# Patient Record
Sex: Female | Born: 1995 | Race: Black or African American | Hispanic: No | Marital: Single | Smoking: Never smoker
Health system: Southern US, Community
[De-identification: ages and names within clinical notes are randomized; demographics above are authoritative.]

## PROBLEM LIST (undated history)

## (undated) DIAGNOSIS — K5090 Crohn's disease, unspecified, without complications: Secondary | ICD-10-CM

## (undated) DIAGNOSIS — R109 Unspecified abdominal pain: Secondary | ICD-10-CM

## (undated) DIAGNOSIS — G8929 Other chronic pain: Secondary | ICD-10-CM

---

## 2013-11-03 ENCOUNTER — Emergency Department (HOSPITAL_COMMUNITY)
Admission: EM | Admit: 2013-11-03 | Discharge: 2013-11-03 | Disposition: A | Discharge status: Home / Self Care | Payer: BC Managed Care – PPO | Attending: Emergency Medicine | Admitting: Emergency Medicine

## 2013-11-03 ENCOUNTER — Encounter (HOSPITAL_COMMUNITY): Payer: Self-pay | Admitting: Emergency Medicine

## 2013-11-03 DIAGNOSIS — N390 Urinary tract infection, site not specified: Secondary | ICD-10-CM | POA: Insufficient documentation

## 2013-11-03 DIAGNOSIS — R110 Nausea: Secondary | ICD-10-CM | POA: Insufficient documentation

## 2013-11-03 DIAGNOSIS — R109 Unspecified abdominal pain: Secondary | ICD-10-CM | POA: Insufficient documentation

## 2013-11-03 DIAGNOSIS — R509 Fever, unspecified: Secondary | ICD-10-CM | POA: Insufficient documentation

## 2013-11-03 DIAGNOSIS — Z3202 Encounter for pregnancy test, result negative: Secondary | ICD-10-CM | POA: Insufficient documentation

## 2013-11-03 LAB — URINALYSIS, ROUTINE W REFLEX MICROSCOPIC
BILIRUBIN URINE: NEGATIVE
Glucose, UA: NEGATIVE mg/dL
Hgb urine dipstick: NEGATIVE
KETONES UR: NEGATIVE mg/dL
NITRITE: POSITIVE — AB
Protein, ur: NEGATIVE mg/dL
SPECIFIC GRAVITY, URINE: 1.011 (ref 1.005–1.030)
UROBILINOGEN UA: 1 mg/dL (ref 0.0–1.0)
pH: 6 (ref 5.0–8.0)

## 2013-11-03 LAB — COMPREHENSIVE METABOLIC PANEL
ALT: 18 U/L (ref 0–35)
ANION GAP: 10 (ref 5–15)
AST: 23 U/L (ref 0–37)
Albumin: 2.8 g/dL — ABNORMAL LOW (ref 3.5–5.2)
Alkaline Phosphatase: 130 U/L — ABNORMAL HIGH (ref 39–117)
BILIRUBIN TOTAL: 0.3 mg/dL (ref 0.3–1.2)
BUN: 5 mg/dL — AB (ref 6–23)
CHLORIDE: 105 meq/L (ref 96–112)
CO2: 25 meq/L (ref 19–32)
Calcium: 8.7 mg/dL (ref 8.4–10.5)
Creatinine, Ser: 0.74 mg/dL (ref 0.50–1.10)
Glucose, Bld: 78 mg/dL (ref 70–99)
Potassium: 3.7 mEq/L (ref 3.7–5.3)
SODIUM: 140 meq/L (ref 137–147)
Total Protein: 6.6 g/dL (ref 6.0–8.3)

## 2013-11-03 LAB — CBC WITH DIFFERENTIAL/PLATELET
BASOS PCT: 0 % (ref 0–1)
Basophils Absolute: 0 10*3/uL (ref 0.0–0.1)
EOS ABS: 0.3 10*3/uL (ref 0.0–0.7)
EOS PCT: 3 % (ref 0–5)
HEMATOCRIT: 27.7 % — AB (ref 36.0–46.0)
HEMOGLOBIN: 8.2 g/dL — AB (ref 12.0–15.0)
LYMPHS PCT: 16 % (ref 12–46)
Lymphs Abs: 1.8 10*3/uL (ref 0.7–4.0)
MCH: 16.6 pg — ABNORMAL LOW (ref 26.0–34.0)
MCHC: 29.6 g/dL — AB (ref 30.0–36.0)
MCV: 56.1 fL — ABNORMAL LOW (ref 78.0–100.0)
MONOS PCT: 5 % (ref 3–12)
Monocytes Absolute: 0.6 10*3/uL (ref 0.1–1.0)
NEUTROS ABS: 8.6 10*3/uL — AB (ref 1.7–7.7)
NEUTROS PCT: 76 % (ref 43–77)
Platelets: 533 10*3/uL — ABNORMAL HIGH (ref 150–400)
RBC: 4.94 MIL/uL (ref 3.87–5.11)
RDW: 19.8 % — ABNORMAL HIGH (ref 11.5–15.5)
WBC: 11.3 10*3/uL — ABNORMAL HIGH (ref 4.0–10.5)

## 2013-11-03 LAB — URINE MICROSCOPIC-ADD ON

## 2013-11-03 LAB — PREGNANCY, URINE: Preg Test, Ur: NEGATIVE

## 2013-11-03 MED ORDER — MORPHINE SULFATE 4 MG/ML IJ SOLN
4.0000 mg | INTRAMUSCULAR | Status: DC | PRN
  Administered 2013-11-03: 4 mg via INTRAVENOUS
  Filled 2013-11-03: qty 1

## 2013-11-03 MED ORDER — CEPHALEXIN 500 MG PO CAPS: 500.0000 mg | ORAL_CAPSULE | Freq: Three times a day (TID) | ORAL | Status: DC

## 2013-11-03 MED ORDER — SODIUM CHLORIDE 0.9 % IV BOLUS (SEPSIS)
1000.0000 mL | Freq: Once | INTRAVENOUS | Status: AC
  Administered 2013-11-03: 1000 mL via INTRAVENOUS

## 2013-11-03 MED ORDER — CEPHALEXIN 250 MG PO CAPS
500.0000 mg | ORAL_CAPSULE | Freq: Once | ORAL | Status: AC
  Administered 2013-11-03: 500 mg via ORAL
  Filled 2013-11-03: qty 2

## 2013-11-03 NOTE — ED Provider Notes (Signed)
CSN: [id number]     Arrival date & time 11/03/13  1105 History   First MD Initiated Contact with Patient 11/03/13 1148     Chief Complaint  Patient presents with  . Abdominal Pain      The history is provided by the patient.   patient reports lower abdominal discomfort and cramping over the past 48 hours.  She reports subjective fever.  She has had nausea without vomiting.  She denies diarrhea.  No urinary complaints.  No vaginal complaints.  No pain with intercourse.  She reports her last normal menstrual cycle was in February 2015.  She received Injection at that time.  She's had no abnormal vaginal bleeding and denies vaginal bleeding at this time.  History reviewed. No pertinent past medical history. History reviewed. No pertinent past surgical history. History reviewed. No pertinent family history. History  Substance Use Topics  . Smoking status: Never Smoker   . Smokeless tobacco: Not on file  . Alcohol Use: No   OB History   Grav Para Term Preterm Abortions TAB SAB Ect Mult Living                 Review of Systems  Gastrointestinal: Positive for abdominal pain.  All other systems reviewed and are negative.     Allergies  Review of patient's allergies indicates no known allergies.  Home Medications   Prior to Admission medications   Not on File   BP 122/60  Pulse 96  Temp(Src) 98.6 F (37 C) (Oral)  Resp 18  SpO2 100% Physical Exam  Nursing note and vitals reviewed. Constitutional: She is oriented to person, place, and time. She appears well-developed and well-nourished. No distress.  HENT:  Head: Normocephalic and atraumatic.  Eyes: EOM are normal.  Neck: Normal range of motion.  Cardiovascular: Normal rate, regular rhythm and normal heart sounds.   Pulmonary/Chest: Effort normal and breath sounds normal.  Abdominal: Soft. She exhibits no distension.  Mild suprapubic tenderness without guarding or rebound  Musculoskeletal: Normal range of motion.   Neurological: She is alert and oriented to person, place, and time.  Skin: Skin is warm and dry.  Psychiatric: She has a normal mood and affect. Judgment normal.    ED Course  Procedures (including critical care time) Labs Review Labs Reviewed  CBC WITH DIFFERENTIAL - Abnormal; Notable for the following:    WBC 11.3 (*)    Hemoglobin 8.2 (*)    HCT 27.7 (*)    MCV 56.1 (*)    MCH 16.6 (*)    MCHC 29.6 (*)    RDW 19.8 (*)    Platelets 533 (*)    Neutro Abs 8.6 (*)    All other components within normal limits  COMPREHENSIVE METABOLIC PANEL - Abnormal; Notable for the following:    BUN 5 (*)    Albumin 2.8 (*)    Alkaline Phosphatase 130 (*)    All other components within normal limits  URINALYSIS, ROUTINE W REFLEX MICROSCOPIC - Abnormal; Notable for the following:    Color, Urine ORANGE (*)    APPearance CLOUDY (*)    Nitrite POSITIVE (*)    Leukocytes, UA TRACE (*)    All other components within normal limits  URINE MICROSCOPIC-ADD ON - Abnormal; Notable for the following:    Squamous Epithelial / LPF FEW (*)    All other components within normal limits  URINE CULTURE  PREGNANCY, URINE    Imaging Review No results found.   EKG  Interpretation None      MDM   Final diagnoses:  Urinary tract infection without hematuria, site unspecified    Patient with nitrite positive urine.  Urine culture sent.  Keflex.  Repeat abdominal exam with mild suprapubic tenderness.  No right lower quadrant tenderness.  Doubt appendicitis.  Patient understands to return to the ER for new or worsening symptoms including but not limited to worsening abdominal pain.    Lyanne Co, MD 11/03/13 [phone]

## 2013-11-03 NOTE — ED Notes (Signed)
Pt reports lower abd pain, cramping, fever, nausea. Denies any vomiting, diarrhea, urinary or vaginal symptoms.

## 2013-11-03 NOTE — Discharge Instructions (Signed)

## 2013-11-04 LAB — URINE CULTURE
COLONY COUNT: NO GROWTH
CULTURE: NO GROWTH

## 2013-11-13 ENCOUNTER — Encounter (HOSPITAL_COMMUNITY): Payer: Self-pay | Admitting: Emergency Medicine

## 2013-11-13 ENCOUNTER — Emergency Department (HOSPITAL_COMMUNITY)
Admission: EM | Admit: 2013-11-13 | Discharge: 2013-11-13 | Disposition: A | Discharge status: Home / Self Care | Payer: BC Managed Care – PPO | Attending: Emergency Medicine | Admitting: Emergency Medicine

## 2013-11-13 DIAGNOSIS — N898 Other specified noninflammatory disorders of vagina: Secondary | ICD-10-CM | POA: Insufficient documentation

## 2013-11-13 DIAGNOSIS — Z792 Long term (current) use of antibiotics: Secondary | ICD-10-CM | POA: Diagnosis not present

## 2013-11-13 DIAGNOSIS — Z3202 Encounter for pregnancy test, result negative: Secondary | ICD-10-CM | POA: Diagnosis not present

## 2013-11-13 LAB — URINALYSIS, ROUTINE W REFLEX MICROSCOPIC
BILIRUBIN URINE: NEGATIVE
Glucose, UA: NEGATIVE mg/dL
Hgb urine dipstick: NEGATIVE
KETONES UR: 15 mg/dL — AB
Nitrite: NEGATIVE
PH: 5.5 (ref 5.0–8.0)
Protein, ur: NEGATIVE mg/dL
Specific Gravity, Urine: 1.029 (ref 1.005–1.030)
Urobilinogen, UA: 0.2 mg/dL (ref 0.0–1.0)

## 2013-11-13 LAB — WET PREP, GENITAL
Trich, Wet Prep: NONE SEEN
Yeast Wet Prep HPF POC: NONE SEEN

## 2013-11-13 LAB — URINE MICROSCOPIC-ADD ON

## 2013-11-13 LAB — POC URINE PREG, ED: Preg Test, Ur: NEGATIVE

## 2013-11-13 MED ORDER — LIDOCAINE HCL (PF) 1 % IJ SOLN
0.9000 mL | Freq: Once | INTRAMUSCULAR | Status: AC
  Administered 2013-11-13: 0.9 mL via INTRADERMAL
  Filled 2013-11-13: qty 5

## 2013-11-13 MED ORDER — AZITHROMYCIN 250 MG PO TABS
1000.0000 mg | ORAL_TABLET | Freq: Once | ORAL | Status: AC
  Administered 2013-11-13: 1000 mg via ORAL
  Filled 2013-11-13: qty 4

## 2013-11-13 MED ORDER — CEFTRIAXONE SODIUM 250 MG IJ SOLR
250.0000 mg | Freq: Once | INTRAMUSCULAR | Status: AC
  Administered 2013-11-13: 250 mg via INTRAMUSCULAR
  Filled 2013-11-13: qty 250

## 2013-11-13 NOTE — ED Notes (Signed)
Pt presents to department for evaluation of vaginal itching and discharge. States she recently finished taking antibiotics for bladder infection. Pt states "I think I have a yeast infection." pt is alert and oriented x4.

## 2013-11-13 NOTE — ED Provider Notes (Signed)
CSN: [id number]     Arrival date & time 11/13/13  1527 History   First MD Initiated Contact with Patient 11/13/13 1858     Chief Complaint  Patient presents with  . Vaginal Itching  . Vaginal Discharge     (Consider location/radiation/quality/duration/timing/severity/associated sxs/prior Treatment) HPI  Patient to the ER with complaints of vaginal discharge and irritation. She reports finishing antibiotic for UTI yesterday and then today noticed the discharge. She has had no abdominal pain, dysuria, fevers, nausea, vomiting, or diarrhea. She is sexually active with a partner she has been with for one year. Only uses protection sometimes.   History reviewed. No pertinent past medical history. History reviewed. No pertinent past surgical history. No family history on file. History  Substance Use Topics  . Smoking status: Never Smoker   . Smokeless tobacco: Not on file  . Alcohol Use: No   OB History   Grav Para Term Preterm Abortions TAB SAB Ect Mult Living                 Review of Systems  Gen: no weight loss, fevers, chills, night sweats  Eyes: no occular draining, occular pain,  No visual changes  Nose: no epistaxis or rhinorrhea  Mouth: no dental pain, no sore throat  Neck: no neck pain  Lungs: No hemoptysis. No wheezing or coughing CV:  No palpitations, dependent edema or orthopnea. No chest pain Abd: no diarrhea. No nausea or vomiting, No abdominal pain  GU: no dysuria or gross hematuria + vaginal discharge MSK:  No muscle weakness, No muscular pain Neuro: no headache, no focal neurologic deficits  Skin: no rash , no wounds Psyche: no complaints of depression or anxiety    Allergies  Review of patient's allergies indicates no known allergies.  Home Medications   Prior to Admission medications   Medication Sig Start Date End Date Taking? Authorizing Provider  cephALEXin (KEFLEX) 500 MG capsule Take 1 capsule (500 mg total) by mouth 3 (three) times daily.  11/03/13   Lyanne Co, MD   BP 122/64  Pulse 89  Temp(Src) 98.5 F (36.9 C) (Oral)  Resp 16  Ht 5\' 1"  (1.549 m)  Wt 120 lb (54.432 kg)  BMI 22.69 kg/m2  SpO2 100% Physical Exam  Nursing note and vitals reviewed. Constitutional: She appears well-developed and well-nourished. No distress.  HENT:  Head: Normocephalic and atraumatic.  Eyes: Pupils are equal, round, and reactive to light.  Neck: Normal range of motion. Neck supple.  Cardiovascular: Normal rate and regular rhythm.   Pulmonary/Chest: Effort normal.  Abdominal: Soft.  Genitourinary: Uterus normal. Cervix exhibits discharge. Cervix exhibits no motion tenderness and no friability.  Neurological: She is alert.  Skin: Skin is warm and dry.    ED Course  Procedures (including critical care time) Labs Review Labs Reviewed  WET PREP, GENITAL - Abnormal; Notable for the following:    Clue Cells Wet Prep HPF POC FEW (*)    WBC, Wet Prep HPF POC MANY (*)    All other components within normal limits  URINALYSIS, ROUTINE W REFLEX MICROSCOPIC - Abnormal; Notable for the following:    APPearance CLOUDY (*)    Ketones, ur 15 (*)    Leukocytes, UA SMALL (*)    All other components within normal limits  URINE MICROSCOPIC-ADD ON - Abnormal; Notable for the following:    Squamous Epithelial / LPF MANY (*)    All other components within normal limits  GC/CHLAMYDIA PROBE AMP  HIV ANTIBODY (ROUTINE TESTING)  RPR  POC URINE PREG, ED    Imaging Review No results found.   EKG Interpretation None      MDM   Final diagnoses:  Vaginal discharge    The wet prep shows WBC, concerns for STD. Will cover with Azithromycin and Rocephin in ED. Pt made aware that if cultures come back positive she will be called. If no call is received then negative for GC.  17 y.o.Julia Mosley's evaluation in the Emergency Department is complete. It has been determined that no acute conditions requiring further emergency intervention are  present at this time. The patient/guardian have been advised of the diagnosis and plan. We have discussed signs and symptoms that warrant return to the ED, such as changes or worsening in symptoms.  Vital signs are stable at discharge. Filed Vitals:   11/13/13 2100  BP: 112/78  Pulse: 100  Temp:   Resp:     Patient/guardian has voiced understanding and agreed to follow-up with the PCP or specialist.     Dorthula Matasiffany G Ryu Cerreta, PA-C 11/13/13 2121

## 2013-11-13 NOTE — Discharge Instructions (Signed)

## 2013-11-14 LAB — RPR

## 2013-11-14 LAB — HIV ANTIBODY (ROUTINE TESTING W REFLEX): HIV 1&2 Ab, 4th Generation: NONREACTIVE

## 2013-11-14 LAB — GC/CHLAMYDIA PROBE AMP
CT PROBE, AMP APTIMA: NEGATIVE
GC Probe RNA: NEGATIVE

## 2013-11-14 NOTE — ED Provider Notes (Signed)
Medical screening examination/treatment/procedure(s) were performed by non-physician practitioner and as supervising physician I was immediately available for consultation/collaboration.   EKG Interpretation None        Naquan Garman T Ewing Fandino, MD 11/14/13 1350 

## 2013-12-10 ENCOUNTER — Emergency Department (HOSPITAL_COMMUNITY)
Admission: EM | Admit: 2013-12-10 | Discharge: 2013-12-10 | Disposition: A | Discharge status: Home / Self Care | Payer: BC Managed Care – PPO | Attending: Emergency Medicine | Admitting: Emergency Medicine

## 2013-12-10 ENCOUNTER — Encounter (HOSPITAL_COMMUNITY): Payer: Self-pay | Admitting: Emergency Medicine

## 2013-12-10 DIAGNOSIS — Z792 Long term (current) use of antibiotics: Secondary | ICD-10-CM | POA: Diagnosis not present

## 2013-12-10 DIAGNOSIS — Z87440 Personal history of urinary (tract) infections: Secondary | ICD-10-CM | POA: Insufficient documentation

## 2013-12-10 DIAGNOSIS — N898 Other specified noninflammatory disorders of vagina: Secondary | ICD-10-CM | POA: Insufficient documentation

## 2013-12-10 DIAGNOSIS — R109 Unspecified abdominal pain: Secondary | ICD-10-CM | POA: Diagnosis present

## 2013-12-10 LAB — CBC WITH DIFFERENTIAL/PLATELET
BASOS ABS: 0 10*3/uL (ref 0.0–0.1)
Basophils Relative: 0 % (ref 0–1)
Eosinophils Absolute: 0.2 10*3/uL (ref 0.0–0.7)
Eosinophils Relative: 3 % (ref 0–5)
HCT: 26.6 % — ABNORMAL LOW (ref 36.0–46.0)
Hemoglobin: 7.9 g/dL — ABNORMAL LOW (ref 12.0–15.0)
LYMPHS ABS: 1.6 10*3/uL (ref 0.7–4.0)
Lymphocytes Relative: 23 % (ref 12–46)
MCH: 16.3 pg — AB (ref 26.0–34.0)
MCHC: 29.7 g/dL — ABNORMAL LOW (ref 30.0–36.0)
MCV: 55 fL — AB (ref 78.0–100.0)
Monocytes Absolute: 0.4 10*3/uL (ref 0.1–1.0)
Monocytes Relative: 6 % (ref 3–12)
NEUTROS ABS: 4.9 10*3/uL (ref 1.7–7.7)
Neutrophils Relative %: 68 % (ref 43–77)
Platelets: 426 10*3/uL — ABNORMAL HIGH (ref 150–400)
RBC: 4.84 MIL/uL (ref 3.87–5.11)
RDW: 19.3 % — AB (ref 11.5–15.5)
Smear Review: INCREASED
WBC: 7.1 10*3/uL (ref 4.0–10.5)

## 2013-12-10 LAB — URINALYSIS, ROUTINE W REFLEX MICROSCOPIC
GLUCOSE, UA: NEGATIVE mg/dL
Hgb urine dipstick: NEGATIVE
Ketones, ur: NEGATIVE mg/dL
NITRITE: NEGATIVE
PH: 6 (ref 5.0–8.0)
Protein, ur: NEGATIVE mg/dL
SPECIFIC GRAVITY, URINE: 1.019 (ref 1.005–1.030)
Urobilinogen, UA: 1 mg/dL (ref 0.0–1.0)

## 2013-12-10 LAB — COMPREHENSIVE METABOLIC PANEL
ALBUMIN: 2.7 g/dL — AB (ref 3.5–5.2)
ALT: 15 U/L (ref 0–35)
AST: 21 U/L (ref 0–37)
Alkaline Phosphatase: 137 U/L — ABNORMAL HIGH (ref 39–117)
Anion gap: 13 (ref 5–15)
BILIRUBIN TOTAL: 0.3 mg/dL (ref 0.3–1.2)
BUN: 8 mg/dL (ref 6–23)
CO2: 23 mEq/L (ref 19–32)
Calcium: 9.1 mg/dL (ref 8.4–10.5)
Chloride: 101 mEq/L (ref 96–112)
Creatinine, Ser: 0.69 mg/dL (ref 0.50–1.10)
GFR calc Af Amer: >90 mL/min (ref >90)
GFR calc non Af Amer: >90 mL/min (ref >90)
Glucose, Bld: 90 mg/dL (ref 70–99)
Potassium: 3.7 mEq/L (ref 3.7–5.3)
SODIUM: 137 meq/L (ref 137–147)
Total Protein: 7 g/dL (ref 6.0–8.3)

## 2013-12-10 LAB — URINE MICROSCOPIC-ADD ON

## 2013-12-10 MED ORDER — SODIUM CHLORIDE 0.9 % IV BOLUS (SEPSIS)
1000.0000 mL | Freq: Once | INTRAVENOUS | Status: AC
  Administered 2013-12-10: 1000 mL via INTRAVENOUS

## 2013-12-10 MED ORDER — ACETAMINOPHEN 500 MG PO TABS
1000.0000 mg | ORAL_TABLET | Freq: Once | ORAL | Status: AC
  Administered 2013-12-10: 1000 mg via ORAL
  Filled 2013-12-10: qty 2

## 2013-12-10 NOTE — Discharge Instructions (Signed)
Abdominal Pain, Women °Abdominal (stomach, pelvic, or belly) pain can be caused by many things. It is important to tell your doctor: °· The location of the pain. °· Does it come and go or is it present all the time? °· Are there things that start the pain (eating certain foods, exercise)? °· Are there other symptoms associated with the pain (fever, nausea, vomiting, diarrhea)? °All of this is helpful to know when trying to find the cause of the pain. °CAUSES  °· Stomach: virus or bacteria infection, or ulcer. °· Intestine: appendicitis (inflamed appendix), regional ileitis (Crohn's disease), ulcerative colitis (inflamed colon), irritable bowel syndrome, diverticulitis (inflamed diverticulum of the colon), or cancer of the stomach or intestine. °· Gallbladder disease or stones in the gallbladder. °· Kidney disease, kidney stones, or infection. °· Pancreas infection or cancer. °· Fibromyalgia (pain disorder). °· Diseases of the female organs: °¨ Uterus: fibroid (non-cancerous) tumors or infection. °¨ Fallopian tubes: infection or tubal pregnancy. °¨ Ovary: cysts or tumors. °¨ Pelvic adhesions (scar tissue). °¨ Endometriosis (uterus lining tissue growing in the pelvis and on the pelvic organs). °¨ Pelvic congestion syndrome (female organs filling up with blood just before the menstrual period). °¨ Pain with the menstrual period. °¨ Pain with ovulation (producing an egg). °¨ Pain with an IUD (intrauterine device, birth control) in the uterus. °¨ Cancer of the female organs. °· Functional pain (pain not caused by a disease, may improve without treatment). °· Psychological pain. °· Depression. °DIAGNOSIS  °Your doctor will decide the seriousness of your pain by doing an examination. °· Blood tests. °· X-rays. °· Ultrasound. °· CT scan (computed tomography, special type of X-ray). °· MRI (magnetic resonance imaging). °· Cultures, for infection. °· Barium enema (dye inserted in the large intestine, to better view it with  X-rays). °· Colonoscopy (looking in intestine with a lighted tube). °· Laparoscopy (minor surgery, looking in abdomen with a lighted tube). °· Major abdominal exploratory surgery (looking in abdomen with a large incision). °TREATMENT  °The treatment will depend on the cause of the pain.  °· Many cases can be observed and treated at home. °· Over-the-counter medicines recommended by your caregiver. °· Prescription medicine. °· Antibiotics, for infection. °· Birth control pills, for painful periods or for ovulation pain. °· Hormone treatment, for endometriosis. °· Nerve blocking injections. °· Physical therapy. °· Antidepressants. °· Counseling with a psychologist or psychiatrist. °· Minor or major surgery. °HOME CARE INSTRUCTIONS  °· Do not take laxatives, unless directed by your caregiver. °· Take over-the-counter pain medicine only if ordered by your caregiver. Do not take aspirin because it can cause an upset stomach or bleeding. °· Try a clear liquid diet (broth or water) as ordered by your caregiver. Slowly move to a bland diet, as tolerated, if the pain is related to the stomach or intestine. °· Have a thermometer and take your temperature several times a day, and record it. °· Bed rest and sleep, if it helps the pain. °· Avoid sexual intercourse, if it causes pain. °· Avoid stressful situations. °· Keep your follow-up appointments and tests, as your caregiver orders. °· If the pain does not go away with medicine or surgery, you may try: °¨ Acupuncture. °¨ Relaxation exercises (yoga, meditation). °¨ Group therapy. °¨ Counseling. °SEEK MEDICAL CARE IF:  °· You notice certain foods cause stomach pain. °· Your home care treatment is not helping your pain. °· You need stronger pain medicine. °· You want your IUD removed. °· You feel faint or   lightheaded. °· You develop nausea and vomiting. °· You develop a rash. °· You are having side effects or an allergy to your medicine. °SEEK IMMEDIATE MEDICAL CARE IF:  °· Your  pain does not go away or gets worse. °· You have a fever. °· Your pain is felt only in portions of the abdomen. The right side could possibly be appendicitis. The left lower portion of the abdomen could be colitis or diverticulitis. °· You are passing blood in your stools (bright red or black tarry stools, with or without vomiting). °· You have blood in your urine. °· You develop chills, with or without a fever. °· You pass out. °MAKE SURE YOU:  °· Understand these instructions. °· Will watch your condition. °· Will get help right away if you are not doing well or get worse. °Document Released: 01/09/2007 Document Revised: 07/29/2013 Document Reviewed: 01/29/2009 °[employer]® Patient Information ©2015 [employer]. This information is not intended to replace advice given to you by your health care provider. Make sure you discuss any questions you have with your health care provider. ° °

## 2013-12-10 NOTE — ED Provider Notes (Signed)
CSN: [id number]     Arrival date & time 12/10/13  4098 History   First MD Initiated Contact with Patient 12/10/13 1010     Chief Complaint  Patient presents with  . Abdominal Pain     (Consider location/radiation/quality/duration/timing/severity/associated sxs/prior Treatment) HPI 18 year old female presents with lower abdominal pain for the past month and a half. States the pain seems to wax and wane. She was seen here and diagnosed with a UTI which seemed improved on Keflex. She was in severe again last month and given Rocephin and azithromycin after GC/Chlamydia cultures were obtained. These turned out to be negative. The patient states she's continuing to have intermittent pain. She does have some pain with intercourse but does not think is necessarily the same pain. She states it feels like a cramping and sharp pain. She's taking ibuprofen with some relief. Has not had a menstrual period in several months but is on the Depo shot. Denies fevers, vomiting, diarrhea, or constipation. She has been having intermittent vaginal discharge over the past month, not worse at this time.  History reviewed. No pertinent past medical history. History reviewed. No pertinent past surgical history. No family history on file. History  Substance Use Topics  . Smoking status: Never Smoker   . Smokeless tobacco: Not on file  . Alcohol Use: No   OB History   Grav Para Term Preterm Abortions TAB SAB Ect Mult Living                 Review of Systems  Constitutional: Negative for fever.  Gastrointestinal: Positive for abdominal pain. Negative for nausea, vomiting, diarrhea and constipation.  Genitourinary: Positive for vaginal discharge. Negative for dysuria, hematuria, vaginal bleeding and difficulty urinating.  All other systems reviewed and are negative.     Allergies  Review of patient's allergies indicates no known allergies.  Home Medications   Prior to Admission medications   Medication  Sig Start Date End Date Taking? Authorizing Provider  cephALEXin (KEFLEX) 500 MG capsule Take 1 capsule (500 mg total) by mouth 3 (three) times daily. 11/03/13   Lyanne Co, MD   BP 122/71  Pulse 95  Temp(Src) 98 F (36.7 C) (Oral)  Resp 18  Ht  (1.549 m)  Wt 120 lb (54.432 kg)  BMI 22.69 kg/m2  SpO2 100%  LMP 07/10/2013 Physical Exam  Nursing note and vitals reviewed. Constitutional: She is oriented to person, place, and time. She appears well-developed and well-nourished. No distress.  HENT:  Head: Normocephalic and atraumatic.  Right Ear: External ear normal.  Left Ear: External ear normal.  Nose: Nose normal.  Eyes: Right eye exhibits no discharge. Left eye exhibits no discharge.  Cardiovascular: Normal rate, regular rhythm and normal heart sounds.   Pulmonary/Chest: Effort normal and breath sounds normal.  Abdominal: Soft. She exhibits no distension. There is tenderness in the suprapubic area.  Neurological: She is alert and oriented to person, place, and time.  Skin: Skin is warm and dry.    ED Course  Procedures (including critical care time) Labs Review Labs Reviewed  URINALYSIS, ROUTINE W REFLEX MICROSCOPIC - Abnormal; Notable for the following:    Color, Urine AMBER (*)    APPearance CLOUDY (*)    Bilirubin Urine SMALL (*)    Leukocytes, UA SMALL (*)    All other components within normal limits  COMPREHENSIVE METABOLIC PANEL - Abnormal; Notable for the following:    Albumin 2.7 (*)    Alkaline Phosphatase 137 (*)  All other components within normal limits  CBC WITH DIFFERENTIAL - Abnormal; Notable for the following:    Hemoglobin 7.9 (*)    HCT 26.6 (*)    MCV 55.0 (*)    MCH 16.3 (*)    MCHC 29.7 (*)    RDW 19.3 (*)    Platelets 426 (*)    All other components within normal limits  URINE MICROSCOPIC-ADD ON - Abnormal; Notable for the following:    Squamous Epithelial / LPF MANY (*)    Bacteria, UA FEW (*)    All other components within normal  limits  POC URINE PREG, ED    Imaging Review No results found.   EKG Interpretation None      MDM   Final diagnoses:  Abdominal pain in female    Workup is unremarkable for a cause of her abdominal pain. Her abdominal pain is suprapubic and mild. She declines repeat pelvic exam as she had this done without any obvious etiology recently. Her hemoglobin is at 8, which is similar to one month ago. I discussed this with the patient that she would likely benefit from being laced on iron. I feel her continued abdominal pain is likely gynecological source and thus [facility]. If her symptoms worsen she has been told to return to the ER. She has no focal tenderness, especially not in her right lower quadrant to suggest appendicitis or ovarian torsion or abscess.    Audree Camel, MD 12/10/13 [phone]

## 2013-12-10 NOTE — ED Notes (Signed)
Pt here for lower abd pain since about a month with episodes where the pain is more severe. Has been seen and treated here for possible bladder infections and possible STD's but is still having the pain. Denies any vaginal itching, burning, reports normal discharge and her urine is darker than normal. Pt states she has been trying to stay hydrated.

## 2013-12-21 ENCOUNTER — Encounter (HOSPITAL_COMMUNITY): Payer: Self-pay | Admitting: Emergency Medicine

## 2013-12-21 ENCOUNTER — Emergency Department (HOSPITAL_COMMUNITY)
Admission: EM | Admit: 2013-12-21 | Discharge: 2013-12-21 | Disposition: A | Discharge status: Home / Self Care | Payer: BC Managed Care – PPO | Attending: Emergency Medicine | Admitting: Emergency Medicine

## 2013-12-21 DIAGNOSIS — R109 Unspecified abdominal pain: Secondary | ICD-10-CM | POA: Diagnosis present

## 2013-12-21 DIAGNOSIS — L259 Unspecified contact dermatitis, unspecified cause: Secondary | ICD-10-CM | POA: Insufficient documentation

## 2013-12-21 DIAGNOSIS — L239 Allergic contact dermatitis, unspecified cause: Secondary | ICD-10-CM

## 2013-12-21 DIAGNOSIS — Z3202 Encounter for pregnancy test, result negative: Secondary | ICD-10-CM | POA: Insufficient documentation

## 2013-12-21 LAB — COMPREHENSIVE METABOLIC PANEL
ALT: 11 U/L (ref 0–35)
AST: 17 U/L (ref 0–37)
Albumin: 2.6 g/dL — ABNORMAL LOW (ref 3.5–5.2)
Alkaline Phosphatase: 133 U/L — ABNORMAL HIGH (ref 39–117)
Anion gap: 13 (ref 5–15)
BILIRUBIN TOTAL: 0.3 mg/dL (ref 0.3–1.2)
BUN: 7 mg/dL (ref 6–23)
CHLORIDE: 100 meq/L (ref 96–112)
CO2: 25 mEq/L (ref 19–32)
Calcium: 8.9 mg/dL (ref 8.4–10.5)
Creatinine, Ser: 0.67 mg/dL (ref 0.50–1.10)
GFR calc Af Amer: >90 mL/min (ref >90)
GFR calc non Af Amer: >90 mL/min (ref >90)
Glucose, Bld: 79 mg/dL (ref 70–99)
Potassium: 3.4 mEq/L — ABNORMAL LOW (ref 3.7–5.3)
Sodium: 138 mEq/L (ref 137–147)
TOTAL PROTEIN: 6.8 g/dL (ref 6.0–8.3)

## 2013-12-21 LAB — CBC WITH DIFFERENTIAL/PLATELET
BASOS PCT: 0 % (ref 0–1)
Basophils Absolute: 0 10*3/uL (ref 0.0–0.1)
EOS ABS: 0.1 10*3/uL (ref 0.0–0.7)
EOS PCT: 1 % (ref 0–5)
HEMATOCRIT: 25 % — AB (ref 36.0–46.0)
HEMOGLOBIN: 7.5 g/dL — AB (ref 12.0–15.0)
Lymphocytes Relative: 15 % (ref 12–46)
Lymphs Abs: 1.6 10*3/uL (ref 0.7–4.0)
MCH: 16.2 pg — AB (ref 26.0–34.0)
MCHC: 30 g/dL (ref 30.0–36.0)
MCV: 54.1 fL — AB (ref 78.0–100.0)
MONOS PCT: 7 % (ref 3–12)
Monocytes Absolute: 0.8 10*3/uL (ref 0.1–1.0)
Neutro Abs: 8.6 10*3/uL — ABNORMAL HIGH (ref 1.7–7.7)
Neutrophils Relative %: 77 % (ref 43–77)
Platelets: 454 10*3/uL — ABNORMAL HIGH (ref 150–400)
RBC: 4.62 MIL/uL (ref 3.87–5.11)
RDW: 19.5 % — ABNORMAL HIGH (ref 11.5–15.5)
WBC: 11.1 10*3/uL — ABNORMAL HIGH (ref 4.0–10.5)

## 2013-12-21 LAB — URINE MICROSCOPIC-ADD ON

## 2013-12-21 LAB — URINALYSIS, ROUTINE W REFLEX MICROSCOPIC
Glucose, UA: NEGATIVE mg/dL
Hgb urine dipstick: NEGATIVE
KETONES UR: 15 mg/dL — AB
Nitrite: NEGATIVE
Protein, ur: NEGATIVE mg/dL
SPECIFIC GRAVITY, URINE: 1.017 (ref 1.005–1.030)
UROBILINOGEN UA: 1 mg/dL (ref 0.0–1.0)
pH: 6.5 (ref 5.0–8.0)

## 2013-12-21 LAB — POC URINE PREG, ED: Preg Test, Ur: NEGATIVE

## 2013-12-21 LAB — LIPASE, BLOOD: Lipase: 15 U/L (ref 11–59)

## 2013-12-21 NOTE — Discharge Instructions (Signed)
For the rash on the arms, abdomen, use 1% hydrocortisone cream, available in the pharmacy or grocery store. For the abdominal pain, take Tylenol if needed. Consider getting a primary care doctor to give you additional medical care, as needed, so, you can avoid coming to the emergency department. You can use resource guide, given below, to help you find a doctor.     Contact Dermatitis Contact dermatitis is a reaction to certain substances that touch the skin. Contact dermatitis can be either irritant contact dermatitis or allergic contact dermatitis. Irritant contact dermatitis does not require previous exposure to the substance for a reaction to occur.Allergic contact dermatitis only occurs if you have been exposed to the substance before. Upon a repeat exposure, your body reacts to the substance.  CAUSES  Many substances can cause contact dermatitis. Irritant dermatitis is most commonly caused by repeated exposure to mildly irritating substances, such as:  Makeup.  Soaps.  Detergents.  Bleaches.  Acids.  Metal salts, such as nickel. Allergic contact dermatitis is most commonly caused by exposure to:  Poisonous plants.  Chemicals (deodorants, shampoos).  Jewelry.  Latex.  Neomycin in triple antibiotic cream.  Preservatives in products, including clothing. SYMPTOMS  The area of skin that is exposed may develop:  Dryness or flaking.  Redness.  Cracks.  Itching.  Pain or a burning sensation.  Blisters. With allergic contact dermatitis, there may also be swelling in areas such as the eyelids, mouth, or genitals.  DIAGNOSIS  Your caregiver can usually tell what the problem is by doing a physical exam. In cases where the cause is uncertain and an allergic contact dermatitis is suspected, a patch skin test may be performed to help determine the cause of your dermatitis. TREATMENT Treatment includes protecting the skin from further contact with the irritating  substance by avoiding that substance if possible. Barrier creams, powders, and gloves may be helpful. Your caregiver may also recommend:  Steroid creams or ointments applied 2 times daily. For best results, soak the rash area in cool water for 20 minutes. Then apply the medicine. Cover the area with a plastic wrap. You can store the steroid cream in the refrigerator for a "chilly" effect on your rash. That may decrease itching. Oral steroid medicines may be needed in more severe cases.  Antibiotics or antibacterial ointments if a skin infection is present.  Antihistamine lotion or an antihistamine taken by mouth to ease itching.  Lubricants to keep moisture in your skin.  Burow's solution to reduce redness and soreness or to dry a weeping rash. Mix one packet or tablet of solution in 2 cups cool water. Dip a clean washcloth in the mixture, wring it out a bit, and put it on the affected area. Leave the cloth in place for 30 minutes. Do this as often as possible throughout the day.  Taking several cornstarch or baking soda baths daily if the area is too large to cover with a washcloth. Harsh chemicals, such as alkalis or acids, can cause skin damage that is like a burn. You should flush your skin for 15 to 20 minutes with cold water after such an exposure. You should also seek immediate medical care after exposure. Bandages (dressings), antibiotics, and pain medicine may be needed for severely irritated skin.  HOME CARE INSTRUCTIONS  Avoid the substance that caused your reaction.  Keep the area of skin that is affected away from hot water, soap, sunlight, chemicals, acidic substances, or anything else that would irritate your skin.  Do not scratch the rash. Scratching may cause the rash to become infected.  You may take cool baths to help stop the itching.  Only take over-the-counter or prescription medicines as directed by your caregiver.  See your caregiver for follow-up care as directed to  make sure your skin is healing properly. SEEK MEDICAL CARE IF:   Your condition is not better after 3 days of treatment.  You seem to be getting worse.  You see signs of infection such as swelling, tenderness, redness, soreness, or warmth in the affected area.  You have any problems related to your medicines. Document Released: 03/11/2000 Document Revised: 06/06/2011 Document Reviewed: 08/17/2010 [facility] Patient Information 2015 [place], [place]. This information is not intended to replace advice given to you by your health care provider. Make sure you discuss any questions you have with your health care provider.    Emergency Department Resource Guide 1) Find a Doctor and Pay Out of Pocket Although you won't have to find out who is covered by your insurance plan, it is a good idea to ask around and get recommendations. You will then need to call the office and see if the doctor you have chosen will accept you as a new patient and what types of options they offer for patients who are self-pay. Some doctors offer discounts or will set up payment plans for their patients who do not have insurance, but you will need to ask so you aren't surprised when you get to your appointment.  2) Contact Your Local Health Department Not all health departments have doctors that can see patients for sick visits, but many do, so it is worth a call to see if yours does. If you don't know where your local health department is, you can check in your phone book. The CDC also has a tool to help you locate your state's health department, and many state websites also have listings of all of their local health departments.  3) Find a Walk-in Clinic If your illness is not likely to be very severe or complicated, you may want to try a walk in clinic. These are popping up all over the country in pharmacies, drugstores, and shopping centers. They're usually staffed by nurse practitioners or physician assistants that have  been trained to treat common illnesses and complaints. They're usually fairly quick and inexpensive. However, if you have serious medical issues or chronic medical problems, these are probably not your best option.  No Primary Care Doctor: - Call Health Connect at  [phone] - they can help you locate a primary care doctor that  accepts your insurance, provides certain services, etc. - Physician Referral Service- [phone]  Chronic Pain Problems: Organization         Address  Phone   Notes  [facility]  [phone] Patients need to be referred by their primary care doctor.   Medication Assistance: Organization         Address  Phone   Notes  [facility] Medication [facility] [address] [place], [place] [address] [phone] --Must be a resident of [facility] -- Must have NO insurance coverage whatsoever (no Medicaid/ Medicare, etc.) -- The pt. MUST have a primary care doctor that directs their care regularly and follows them in the community   [facility]  [phone]   [employer]  [phone]    Agencies that provide inexpensive medical care: Organization  Address  Phone   Notes  [place]  [phone]   [facility]    [phone]   [facility] [place], [place] [address] [phone]   [place] [address] [place] [place] [phone]   Planned Parenthood    [phone]   [facility]    [phone]   [place] and [place], [place] Phone:  [phone], Fax:  [phone] Hours of Operation:  9 am - 6 pm, M-F.  Also accepts Medicaid/Medicare and self-pay.  [facility] [place] [address], [address], [place] Phone: [phone], Fax: [phone]. Hours of Operation:  8:30 am - 5:30 pm, M-F.  Also accepts Medicaid and  self-pay.  [facility] [address], [place] Phone: [phone]   [place], [place], [place] [phone], Ext. [phone] Mondays & Thursdays: 7-9 AM.  First 15 patients are seen on a first come, first serve basis.    [place] Providers:  Organization         Address  Phone   Notes  [facility] [address], [place] [phone] Also accepts self-pay patients.  [facility] [id number] [place], [place]  [phone]   [place], [address], [place] [phone]   [facility] Family Medicine [address], [place] [phone]   Lucianne Lei [address], [place]   [phone] Only accepts [place] Access [place] patients after they have their name applied to their card.   Self-Pay (no insurance) in [facility]:  Organization         Address  Phone   Notes  Sickle Cell Patients, [facility] Internal Medicine [place] [phone]   [facility] [place] [phone]   [facility] [place]  [place], [place], [place] [phone]   [facility]/Dr. Osei-Bonsu  [address], [place] or [address], [place] [phone] Phone number for both [place] and [place] locations is the same.  [facility]gent [facility] and [facility] [address], [place] [phone]   [facility] [address] [place] or [address] [phone]   [facility] [address], [place] [phone], phone; [phone], fax Sees patients 1st and 3rd Saturday of every month.  Must not qualify for public or private insurance (i.e. Medicaid, Medicare, [place] Health Choice, Veterans' Benefits)  Household income  should be no more than 200% of the poverty level The clinic cannot treat you if you are pregnant or think you are pregnant  Sexually transmitted diseases are not treated at the clinic.    Dental Care: Organization         Address  Phone  Notes  [facility] of [facility] [phone] Accepts children up to age 88 who are enrolled in [place] or [place]; pregnant women with a Medicaid card; and children who have applied for Medicaid or [place] Health Choice, but were declined, whose parents can pay a reduced fee at time  of service.  [facility] Department of [facility]  [address], [place] [phone] Accepts children up to age 51 who are enrolled in [place] or [place]; pregnant women with a Medicaid card; and children who have applied for Medicaid or [place] Health Choice, but were declined, whose parents can pay a reduced fee at time of service.  [place] Adult Dental Access PROGRAM  [place] [phone] Patients are seen by appointment only. Walk-ins are not accepted. [place] will see patients 33 years of age and older. Monday - Tuesday (8am-5pm) Most Wednesdays (8:30-5pm) $30 per visit, cash only  [facility] Adult Dental Access PROGRAM  [address], [facility] [phone] Patients are seen by appointment only. Walk-ins are not accepted. [place] will see patients 44 years of age and older. One Wednesday Evening (Monthly: Volunteer Based).  $30 per visit, cash only  [place]  [phone] for adults; Children under age 38, call Graduate Pediatric Dentistry at [phone]. Children aged 82-14, please call [phone] to request a pediatric application.  Dental services are provided in all areas of dental care including fillings, crowns and bridges, complete and partial dentures, implants, gum treatment,  root canals, and extractions. Preventive care is also provided. Treatment is provided to both adults and children. Patients are selected via a lottery and there is often a waiting list.   [facility] [address] [place]  [phone] [email or web address]   [facility] [address] [place], [place] [phone], Ext. [phone] Second and Fourth Thursday of each month, opens at 6:30 AM; Clinic ends at 9 AM.  Patients are seen on a first-come first-served basis, and a limited number are seen during each clinic.   [facility]  [address] [place]ard Danker [place], [place] [phone]   Eligibility Requirements You must have lived in [place], [place], or [place] counties for at least the last three months.   You cannot be eligible for state or federal sponsored [employer], including [employer], [place], or [employer].   You generally cannot be eligible for healthcare insurance through your employer.    How to apply: Eligibility screenings are held every Tuesday and Wednesday afternoon from 1:00 pm until 4:00 pm. You do not need an appointment for the interview!  [facility] [address] [place], [place]   [place]  [place] Department  [place]  [phone]    Behavioral Health Resources in the Community: Intensive Outpatient Programs Organization         Address  Phone  Notes  [place] [place], [place] [phone]   [facility] Outpatient [address], [place], [place]   ADS: [facility] [address], [place], [place]   [place] [address] [place] [address],  [place], [place] or [phone]   Substance Abuse Resources Organization         Address  Phone  Notes  Al[facility] and Drug Services   [phone]   [place]  [phone]   [facility]   Chinita Pester  [phone]   Residential & Outpatient Substance Abuse Program  [phone]   Psychological Services Organization         Address  Phone  Notes  [employer] [facility]  [phone]   [facility] Services  [phone]   [place] Mental Health [address], [place] [phone] or [phone]    Mobile Crisis Teams Organization         Address  Phone  Notes  [facility], Mobile Crisis Care Unit  [phone]   [facility]  [address] [place], [place] [phone]   Doristine Locks [address] [place] [phone]    Self-Help/Support Groups Organization         Address  Phone             Notes  [facility]oc. of [place] - variety of support groups  [phone]- [phone] Call for more information  Narcotics Anonymous (NA), [facility]aring [facility] [address], [employer] [place]  2 meetings at this location   Statistician         Address  Phone  Notes  [facility] [address],    [place]  [phone]   [facility]  [address] [place] [address], [place], [place] [phone]   [facility] [address] [place], [place] [phone] Admissions: 8am-3pm M-F  [facility] [address]    [place], [place] [phone]   [facility] [address] [place], [place], [place] [phone]   [facility] [address]  [place], [place] [phone]   [facility] - Intensive Outpatient [address], [place], [place] [phone]   [facility] (Addiction Recovery Care [facility] [address] [place].,  [place], [place] [phone] or [phone]   [facility] ([facility]) [address], [place], [place] [phone] Accepts Medicaid  [facility] [address]  [place] [phone] Substance Abuse/Addiction Treatment   [facility] Organization         Address  Phone  Notes  [facility]  [phone]   Angie Fava, PhD [address] [place], [place]   [phone] or [phone]   [facility] Behavioral   [address] [place], [place] [phone]   [facility] [address], [place], [place] [phone] Insurance/Medicaid/sponsorship through [facility] Families [address]                                    [place], [place] [phone] Therapy/tele-psych/case  [facility] [address], [place] [phone]    Dr. Lolly Mustache  [phone]   [facility] [place]  [employer] [facility] Dept. 1) [address], [place] 2) [address] [place] 3)  [address], [place] [phone]  [phone]   [facility] Child Abuse Hotline [phone] or [phone] (After Hours)

## 2013-12-21 NOTE — ED Notes (Signed)
Pt states that she has had lower abdominal pain and has been seen here multiple time for same.  Pts that pain has continued. Pt has followed up with GYN. Pt states that she noticed "spots"on her skin today. NAD noted pt a&ox4

## 2013-12-21 NOTE — ED Notes (Signed)
Pt c/o lower abd pain x 2 months, sts seen here 4 times for the same and went to GYN x 2. Pt reports she had a pelvic done and everything came back normal. Also had vaginal ultrasound and was told that looked normal too. Reports pain in constant everyday, worse in mornings and night. Denies vaginal bleeding/discharge currently. Pt sts over the past month she has had a little vaginal discharge. Pt reports this morning when she woke up she noticed a rash on abd and arms. OB gave medication for yeast infection and UTI but didn't take it because the results came back negative. Nad, skin warm and dry, resp e/u.

## 2013-12-21 NOTE — ED Provider Notes (Signed)
CSN: [id number]     Arrival date & time 12/21/13  1407 History   First MD Initiated Contact with Patient 12/21/13 1511     Chief Complaint  Patient presents with  . Abdominal Pain     (Consider location/radiation/quality/duration/timing/severity/associated sxs/prior Treatment) Patient is a 18 y.o. female presenting with abdominal pain. The history is provided by the patient.  Abdominal Pain  She is here because of noticing bumps on her right arm and abdomen, today. They do not itch or hurt. She denies fever, chills, nausea, vomiting, weakness, or dizziness. She has had ongoing abdominal pain, for 2 months. She was evaluated for this several times in ED, and refer to GYN. She saw the gynecologist last week, and had a pelvic examination done. She was again seen 2 days ago, by GYN and at that time had a pelvic ultrasound done, which was stated to be normal. 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 She states things are going well there. There are no other known modifying factors.  History reviewed. No pertinent past medical history. History reviewed. No pertinent past surgical history. No family history on file. History  Substance Use Topics  . Smoking status: Never Smoker   . Smokeless tobacco: Not on file  . Alcohol Use: No   OB History   Grav Para Term Preterm Abortions TAB SAB Ect Mult Living                 Review of Systems  Gastrointestinal: Positive for abdominal pain.  All other systems reviewed and are negative.     Allergies  Review of patient's allergies indicates no known allergies.  Home Medications   Prior to Admission medications   Medication Sig Start Date End Date Taking? Authorizing Provider  ibuprofen (ADVIL,MOTRIN) 200 MG tablet Take 400 mg by mouth every 6 (six) hours as needed (pain).   Yes Historical Provider, MD   BP 113/63  Pulse 87  Temp(Src) 98.5 F (36.9 C) (Oral)  Resp 20  SpO2 99%  LMP 07/10/2013 Physical Exam  Nursing note and vitals  reviewed. Constitutional: She is oriented to person, place, and time. She appears well-developed and well-nourished.  HENT:  Head: Normocephalic and atraumatic.  Right Ear: External ear normal.  Left Ear: External ear normal.  Eyes: Conjunctivae and EOM are normal. Pupils are equal, round, and reactive to light.  Neck: Normal range of motion and phonation normal. Neck supple.  Cardiovascular: Normal rate, regular rhythm and normal heart sounds.   Pulmonary/Chest: Effort normal and breath sounds normal. She exhibits no bony tenderness.  Abdominal: Soft. She exhibits no mass. There is tenderness (Bilateral lower quadrants, and suprapubic region, mild). There is no rebound and no guarding.  Musculoskeletal: Normal range of motion.  Neurological: She is alert and oriented to person, place, and time. No cranial nerve deficit or sensory deficit. She exhibits normal muscle tone. Coordination normal.  Skin: Skin is warm, dry and intact.  Scattered tiny skin-colored papules of the right arm and right lateral chest wall. The appearance of these papules are nonspecific, but most likely represent an allergic dermatitis.  Psychiatric: She has a normal mood and affect. Her behavior is normal. Judgment and thought content normal.    ED Course  Procedures (including critical care time)  Medications - No data to display  Patient Vitals for the past 24 hrs:  BP Temp Temp src Pulse Resp SpO2  12/21/13 1708 113/63 mmHg 98.5 F (36.9 C) Oral 87 20 99 %  12/21/13 1422 113/62 mmHg 99 F (37.2 C) Oral 115 18 100 %    5:04 PM Reevaluation with update and discussion. After initial assessment and treatment, an updated evaluation reveals there are no other additional c/o. Charlotte Brafford L    Labs Review Labs Reviewed  CBC WITH DIFFERENTIAL - Abnormal; Notable for the following:    WBC 11.1 (*)    Hemoglobin 7.5 (*)    HCT 25.0 (*)    MCV 54.1 (*)    MCH 16.2 (*)    RDW 19.5 (*)    Platelets 454 (*)     Neutro Abs 8.6 (*)    All other components within normal limits  COMPREHENSIVE METABOLIC PANEL - Abnormal; Notable for the following:    Potassium 3.4 (*)    Albumin 2.6 (*)    Alkaline Phosphatase 133 (*)    All other components within normal limits  URINALYSIS, ROUTINE W REFLEX MICROSCOPIC - Abnormal; Notable for the following:    Color, Urine AMBER (*)    APPearance CLOUDY (*)    Bilirubin Urine SMALL (*)    Ketones, ur 15 (*)    Leukocytes, UA MODERATE (*)    All other components within normal limits  URINE MICROSCOPIC-ADD ON - Abnormal; Notable for the following:    Squamous Epithelial / LPF MANY (*)    Bacteria, UA MANY (*)    All other components within normal limits  LIPASE, BLOOD  POC URINE PREG, ED    Imaging Review No results found.   EKG Interpretation None      MDM   Final diagnoses:  Allergic dermatitis  Abdominal pain, unspecified abdominal location    Nonspecific allergic dermatitis, without complicating features. Ongoing chronic abdominal pain, status post extensive evaluation, including referral and consultation.    Nursing Notes Reviewed/ Care Coordinated Applicable Imaging Reviewed Interpretation of Laboratory Data incorporated into ED treatment  The patient appears reasonably screened and/or stabilized for discharge and I doubt any other medical condition or other [facility] requiring further screening, evaluation, or treatment in the ED at this time prior to discharge.  Plan: Home Medications- Hydrocortisone for rash. Tylenol for pain; Home Treatments- rest; return here if the recommended treatment, does not improve the symptoms; Recommended follow up- PCP of choice prn    Flint Melter, MD 12/21/13 1720

## 2013-12-31 ENCOUNTER — Encounter (HOSPITAL_COMMUNITY): Payer: Self-pay | Admitting: Emergency Medicine

## 2013-12-31 ENCOUNTER — Emergency Department (HOSPITAL_COMMUNITY)
Admission: EM | Admit: 2013-12-31 | Discharge: 2014-01-01 | Disposition: A | Discharge status: Home / Self Care | Payer: BC Managed Care – PPO | Attending: Emergency Medicine | Admitting: Emergency Medicine

## 2013-12-31 DIAGNOSIS — R112 Nausea with vomiting, unspecified: Secondary | ICD-10-CM | POA: Diagnosis not present

## 2013-12-31 DIAGNOSIS — G8929 Other chronic pain: Secondary | ICD-10-CM | POA: Diagnosis not present

## 2013-12-31 DIAGNOSIS — R1030 Lower abdominal pain, unspecified: Secondary | ICD-10-CM | POA: Insufficient documentation

## 2013-12-31 DIAGNOSIS — R400 Somnolence: Secondary | ICD-10-CM | POA: Insufficient documentation

## 2013-12-31 DIAGNOSIS — R197 Diarrhea, unspecified: Secondary | ICD-10-CM | POA: Insufficient documentation

## 2013-12-31 DIAGNOSIS — Z3202 Encounter for pregnancy test, result negative: Secondary | ICD-10-CM | POA: Diagnosis not present

## 2013-12-31 HISTORY — DX: Unspecified abdominal pain: R10.9

## 2013-12-31 HISTORY — DX: Other chronic pain: G89.29

## 2013-12-31 LAB — URINALYSIS, ROUTINE W REFLEX MICROSCOPIC
Bilirubin Urine: NEGATIVE
GLUCOSE, UA: NEGATIVE mg/dL
Hgb urine dipstick: NEGATIVE
Ketones, ur: NEGATIVE mg/dL
Nitrite: NEGATIVE
Protein, ur: 30 mg/dL — AB
SPECIFIC GRAVITY, URINE: 1.026 (ref 1.005–1.030)
Urobilinogen, UA: 0.2 mg/dL (ref 0.0–1.0)
pH: 6 (ref 5.0–8.0)

## 2013-12-31 LAB — BASIC METABOLIC PANEL
Anion gap: 15 (ref 5–15)
BUN: 8 mg/dL (ref 6–23)
CO2: 20 meq/L (ref 19–32)
Calcium: 9.2 mg/dL (ref 8.4–10.5)
Chloride: 97 mEq/L (ref 96–112)
Creatinine, Ser: 0.71 mg/dL (ref 0.50–1.10)
GFR calc Af Amer: >90 mL/min (ref >90)
GFR calc non Af Amer: >90 mL/min (ref >90)
GLUCOSE: 118 mg/dL — AB (ref 70–99)
POTASSIUM: 4.1 meq/L (ref 3.7–5.3)
SODIUM: 132 meq/L — AB (ref 137–147)

## 2013-12-31 LAB — CBC
HEMATOCRIT: 28.6 % — AB (ref 36.0–46.0)
HEMOGLOBIN: 8.8 g/dL — AB (ref 12.0–15.0)
MCH: 16.3 pg — ABNORMAL LOW (ref 26.0–34.0)
MCHC: 30.8 g/dL (ref 30.0–36.0)
MCV: 53 fL — AB (ref 78.0–100.0)
Platelets: 853 10*3/uL — ABNORMAL HIGH (ref 150–400)
RBC: 5.4 MIL/uL — AB (ref 3.87–5.11)
RDW: 20.1 % — ABNORMAL HIGH (ref 11.5–15.5)
WBC: 13.3 10*3/uL — AB (ref 4.0–10.5)

## 2013-12-31 LAB — URINE MICROSCOPIC-ADD ON

## 2013-12-31 LAB — POC URINE PREG, ED: Preg Test, Ur: NEGATIVE

## 2013-12-31 MED ORDER — KETOROLAC TROMETHAMINE 30 MG/ML IJ SOLN
30.0000 mg | Freq: Once | INTRAMUSCULAR | Status: AC
  Administered 2013-12-31: 30 mg via INTRAVENOUS
  Filled 2013-12-31: qty 1

## 2013-12-31 MED ORDER — DICYCLOMINE HCL 20 MG PO TABS: 20.0000 mg | ORAL_TABLET | Freq: Three times a day (TID) | ORAL | Status: AC

## 2013-12-31 MED ORDER — SODIUM CHLORIDE 0.9 % IV BOLUS (SEPSIS)
1000.0000 mL | Freq: Once | INTRAVENOUS | Status: AC
  Administered 2013-12-31: 1000 mL via INTRAVENOUS

## 2013-12-31 NOTE — ED Notes (Signed)
Pt states intermittent abdominal pain for 2 months to lower abdominal.  LMP:  May 2015, was on depo and just go on implant in arm.  Pt states she was having lower abdominal pain and nausea and passed at.  Pt states that the right side of her face is hurting bad.  NO notable injury.  No neck pain.  Ambulatory

## 2013-12-31 NOTE — ED Provider Notes (Signed)
CSN: [id number]     Arrival date & time 12/31/13  1643 History   First MD Initiated Contact with Patient 12/31/13 2106     Chief Complaint  Patient presents with  . Loss of Consciousness  . Nausea  . Abdominal Pain     (Consider location/radiation/quality/duration/timing/severity/associated sxs/prior Treatment) Patient is a 18 y.o. female presenting with abdominal pain. The history is provided by the patient.  Abdominal Pain Pain location: lower. Pain quality: aching   Pain radiates to:  Does not radiate Pain severity:  Mild Onset quality:  Gradual Duration:  10 weeks Timing:  Intermittent Progression:  Waxing and waning Chronicity:  New Context: not previous surgeries, not recent illness, not recent travel and not trauma   Context comment:  Pain for 2-3 months, lower, with some n/v and some diarrhea at times. Unchanged. Has had workup in past with gynecologist and neg imaging. Concerned that she has no answer to her sxs Relieved by:  Nothing Worsened by:  Nothing tried Ineffective treatments:  None tried Associated symptoms: diarrhea, nausea and vomiting   Associated symptoms: no anorexia, no chest pain, no cough, no dysuria, no fatigue, no fever, no hematemesis, no hematochezia, no hematuria, no shortness of breath, no vaginal bleeding and no vaginal discharge   Risk factors: no alcohol abuse, not elderly, has not had multiple surgeries and not obese     Past Medical History  Diagnosis Date  . Chronic abdominal pain    History reviewed. No pertinent past surgical history. No family history on file. History  Substance Use Topics  . Smoking status: Never Smoker   . Smokeless tobacco: Not on file  . Alcohol Use: No   OB History   Grav Para Term Preterm Abortions TAB SAB Ect Mult Living                 Review of Systems  Constitutional: Negative for fever, activity change, appetite change and fatigue.  HENT: Negative for congestion and rhinorrhea.   Eyes: Negative  for discharge, redness and itching.  Respiratory: Negative for cough, shortness of breath and wheezing.   Cardiovascular: Negative for chest pain.  Gastrointestinal: Positive for nausea, vomiting, abdominal pain and diarrhea. Negative for hematochezia, anorexia and hematemesis.  Genitourinary: Negative for dysuria, hematuria, vaginal bleeding and vaginal discharge.  Musculoskeletal: Negative for back pain.  Skin: Negative for rash and wound.  Neurological: Negative for syncope.      Allergies  Review of patient's allergies indicates no known allergies.  Home Medications   Prior to Admission medications   Medication Sig Start Date End Date Taking? Authorizing Provider  diphenhydrAMINE (SOMINEX) 25 MG tablet Take 25 mg by mouth at bedtime as needed for allergies.   Yes Historical Provider, MD  ibuprofen (ADVIL,MOTRIN) 200 MG tablet Take 400 mg by mouth every 6 (six) hours as needed (pain).   Yes Historical Provider, MD  dicyclomine (BENTYL) 20 MG tablet Take 1 tablet (20 mg total) by mouth 3 (three) times daily. 12/31/13   Pilar Jarvis, MD   BP 106/53  Pulse 97  Temp(Src) 99.4 F (37.4 C) (Oral)  Resp 12  SpO2 98%  LMP 07/10/2013 Physical Exam  Constitutional: She is oriented to person, place, and time. She appears well-developed and well-nourished. No distress.  HENT:  Head: Normocephalic and atraumatic.  Mouth/Throat: Oropharynx is clear and moist. No oropharyngeal exudate.  Eyes: Conjunctivae and EOM are normal. Pupils are equal, round, and reactive to light. Right eye exhibits no discharge. Left eye  exhibits no discharge. No scleral icterus.  Neck: Normal range of motion. Neck supple.  Cardiovascular: Normal rate, regular rhythm and normal heart sounds.   No murmur heard. Pulmonary/Chest: Effort normal and breath sounds normal. No respiratory distress. She has no wheezes. She has no rales.  Abdominal: Soft. She exhibits no distension and no mass. There is tenderness (mild  lower abd ttp, no rigidity, no guarding, no peritonitis, neg murphy, neg mcburney).  Genitourinary:  Refused by patient  Neurological: She is alert and oriented to person, place, and time. She exhibits normal muscle tone. Coordination normal.  Skin: Skin is warm. No rash noted. She is not diaphoretic.    ED Course  Procedures (including critical care time) Labs Review Labs Reviewed  BASIC METABOLIC PANEL - Abnormal; Notable for the following:    Sodium 132 (*)    Glucose, Bld 118 (*)    All other components within normal limits  CBC - Abnormal; Notable for the following:    WBC 13.3 (*)    RBC 5.40 (*)    Hemoglobin 8.8 (*)    HCT 28.6 (*)    MCV 53.0 (*)    MCH 16.3 (*)    RDW 20.1 (*)    Platelets 853 (*)    All other components within normal limits  URINALYSIS, ROUTINE W REFLEX MICROSCOPIC - Abnormal; Notable for the following:    Color, Urine AMBER (*)    APPearance CLOUDY (*)    Protein, ur 30 (*)    Leukocytes, UA TRACE (*)    All other components within normal limits  URINE MICROSCOPIC-ADD ON - Abnormal; Notable for the following:    Squamous Epithelial / LPF MANY (*)    Casts HYALINE CASTS (*)    All other components within normal limits  POC URINE PREG, ED    Imaging Review No results found.   EKG Interpretation None      MDM   MDM: Patient is a 18 year old African female with no medical history comes in with chief complaint of abdominal pain as well as syncope. She states she has had abdominal pain in her lower abdomen for 2 to 3 months since she started her sophomore year of college. She states the pain is diffuse lower and she has been worked up she states with ultrasound and has seen her gynecologist before. She's been to our ED 3 or 4 times with similar symptoms. She is concerned that she doesn't have an answer to her symptoms. She states the pain is unchanged intermittent lower abdominal pain with some vomiting as well as some diarrhea. She denies any  urine symptoms denies any vaginal bleeding denies any vaginal discharge denies fevers chills chest pain short of breath. She states the pain is unchanged. She states today she is hurting so bad she passed out. Here shows mild tachycardia at times and is anxious her abdomen is soft with some mild palpation diffusely negative McBurney's point no peritonitis. Neuro exams are benign. Suspect patient has chronic abdominal pain possibly anxiety related. 2 months no changes symptoms is unlikely an acute emergency. She refused a pelvic exam after being explained the risks of not being able to do a full exam and missing diagnoses but she is okay with this. We'll check screening labs and urine. Labs and urine were unremarkable. She feels better with pain control. Tachycardia resolved with fluids. Unlikely appy, torsion, chole, or other intraabdominal emergency. I explained to her at length the need followup with her PCP. Instructed  to return if symptoms change. Will give rx for Bentyl. Discharged  Final diagnoses:  Lower abdominal pain    Discharge Medication List as of 12/31/2013 11:52 PM    START taking these medications   Details  dicyclomine (BENTYL) 20 MG tablet Take 1 tablet (20 mg total) by mouth 3 (three) times daily., Starting 12/31/2013, Until Discontinued, Print       [facility]  Schedule an appointment as soon as possible for a visit    Discharged   Pilar Jarvisoug Bryam Taborda, MD 01/01/14 [phone]

## 2013-12-31 NOTE — ED Notes (Signed)
Dr Harrison at bedside

## 2013-12-31 NOTE — ED Notes (Signed)
PO fluid given 

## 2013-12-31 NOTE — Discharge Instructions (Signed)
Abdominal Pain, Women °Abdominal (stomach, pelvic, or belly) pain can be caused by many things. It is important to tell your doctor: °· The location of the pain. °· Does it come and go or is it present all the time? °· Are there things that start the pain (eating certain foods, exercise)? °· Are there other symptoms associated with the pain (fever, nausea, vomiting, diarrhea)? °All of this is helpful to know when trying to find the cause of the pain. °CAUSES  °· Stomach: virus or bacteria infection, or ulcer. °· Intestine: appendicitis (inflamed appendix), regional ileitis (Crohn's disease), ulcerative colitis (inflamed colon), irritable bowel syndrome, diverticulitis (inflamed diverticulum of the colon), or cancer of the stomach or intestine. °· Gallbladder disease or stones in the gallbladder. °· Kidney disease, kidney stones, or infection. °· Pancreas infection or cancer. °· Fibromyalgia (pain disorder). °· Diseases of the female organs: °¨ Uterus: fibroid (non-cancerous) tumors or infection. °¨ Fallopian tubes: infection or tubal pregnancy. °¨ Ovary: cysts or tumors. °¨ Pelvic adhesions (scar tissue). °¨ Endometriosis (uterus lining tissue growing in the pelvis and on the pelvic organs). °¨ Pelvic congestion syndrome (female organs filling up with blood just before the menstrual period). °¨ Pain with the menstrual period. °¨ Pain with ovulation (producing an egg). °¨ Pain with an IUD (intrauterine device, birth control) in the uterus. °¨ Cancer of the female organs. °· Functional pain (pain not caused by a disease, may improve without treatment). °· Psychological pain. °· Depression. °DIAGNOSIS  °Your doctor will decide the seriousness of your pain by doing an examination. °· Blood tests. °· X-rays. °· Ultrasound. °· CT scan (computed tomography, special type of X-ray). °· MRI (magnetic resonance imaging). °· Cultures, for infection. °· Barium enema (dye inserted in the large intestine, to better view it with  X-rays). °· Colonoscopy (looking in intestine with a lighted tube). °· Laparoscopy (minor surgery, looking in abdomen with a lighted tube). °· Major abdominal exploratory surgery (looking in abdomen with a large incision). °TREATMENT  °The treatment will depend on the cause of the pain.  °· Many cases can be observed and treated at home. °· Over-the-counter medicines recommended by your caregiver. °· Prescription medicine. °· Antibiotics, for infection. °· Birth control pills, for painful periods or for ovulation pain. °· Hormone treatment, for endometriosis. °· Nerve blocking injections. °· Physical therapy. °· Antidepressants. °· Counseling with a psychologist or psychiatrist. °· Minor or major surgery. °HOME CARE INSTRUCTIONS  °· Do not take laxatives, unless directed by your caregiver. °· Take over-the-counter pain medicine only if ordered by your caregiver. Do not take aspirin because it can cause an upset stomach or bleeding. °· Try a clear liquid diet (broth or water) as ordered by your caregiver. Slowly move to a bland diet, as tolerated, if the pain is related to the stomach or intestine. °· Have a thermometer and take your temperature several times a day, and record it. °· Bed rest and sleep, if it helps the pain. °· Avoid sexual intercourse, if it causes pain. °· Avoid stressful situations. °· Keep your follow-up appointments and tests, as your caregiver orders. °· If the pain does not go away with medicine or surgery, you may try: °¨ Acupuncture. °¨ Relaxation exercises (yoga, meditation). °¨ Group therapy. °¨ Counseling. °SEEK MEDICAL CARE IF:  °· You notice certain foods cause stomach pain. °· Your home care treatment is not helping your pain. °· You need stronger pain medicine. °· You want your IUD removed. °· You feel faint or   lightheaded. °· You develop nausea and vomiting. °· You develop a rash. °· You are having side effects or an allergy to your medicine. °SEEK IMMEDIATE MEDICAL CARE IF:  °· Your  pain does not go away or gets worse. °· You have a fever. °· Your pain is felt only in portions of the abdomen. The right side could possibly be appendicitis. The left lower portion of the abdomen could be colitis or diverticulitis. °· You are passing blood in your stools (bright red or black tarry stools, with or without vomiting). °· You have blood in your urine. °· You develop chills, with or without a fever. °· You pass out. °MAKE SURE YOU:  °· Understand these instructions. °· Will watch your condition. °· Will get help right away if you are not doing well or get worse. °Document Released: 01/09/2007 Document Revised: 07/29/2013 Document Reviewed: 01/29/2009 °[employer]® Patient Information ©2015 [employer]. This information is not intended to replace advice given to you by your health care provider. Make sure you discuss any questions you have with your health care provider. ° °

## 2014-01-01 NOTE — ED Provider Notes (Signed)
Medical screening examination/treatment/procedure(s) were conducted as a shared visit with resident physician and myself.  I personally evaluated the patient during the encounter.   I interviewed and examined the patient. Lungs are CTAB. Cardiac exam wnl. Abdomen soft., lower abd ttp w/out focality. Pt seen multiple times for similar sx. She denies seeing blood in her stool. I interpreted/reviewed the labs and/or imaging which were non-contributory.  Will start bentyl and refer to GI for eval. Return precautions provided.   Purvis Sheffield, MD 01/01/14 1149

## 2014-01-14 ENCOUNTER — Other Ambulatory Visit: Payer: Self-pay | Admitting: Gastroenterology

## 2014-01-14 DIAGNOSIS — R112 Nausea with vomiting, unspecified: Secondary | ICD-10-CM

## 2014-01-14 DIAGNOSIS — R1084 Generalized abdominal pain: Secondary | ICD-10-CM

## 2014-01-16 ENCOUNTER — Other Ambulatory Visit: Payer: Self-pay | Admitting: Gastroenterology

## 2014-01-16 ENCOUNTER — Ambulatory Visit
Admission: RE | Admit: 2014-01-16 | Discharge: 2014-01-16 | Disposition: A | Discharge status: Home / Self Care | Payer: BC Managed Care – PPO | Source: Ambulatory Visit | Attending: Gastroenterology | Admitting: Gastroenterology

## 2014-01-16 DIAGNOSIS — R1084 Generalized abdominal pain: Secondary | ICD-10-CM

## 2014-01-16 DIAGNOSIS — R112 Nausea with vomiting, unspecified: Secondary | ICD-10-CM

## 2014-01-16 MED ORDER — IOHEXOL 300 MG/ML  SOLN
100.0000 mL | Freq: Once | INTRAMUSCULAR | Status: AC | PRN
  Administered 2014-01-16: 100 mL via INTRAVENOUS

## 2015-08-19 IMAGING — CT CT ABD-PELV W/ CM
2 of 4 series · 16 of 46 positions shown, 18 images · IV contrast (READICAT/WATER & [ID] OMNI 300)
Comparison: None.

CLINICAL DATA: Lower abdominal pain. Evaluate for terminal ileum
stricture and/or perirectal abscess. Symptoms have occurred for 2
months.

EXAM:
CT ABDOMEN AND PELVIS WITH CONTRAST
TECHNIQUE: Multidetector CT imaging of the abdomen and pelvis was performed
using the standard protocol following bolus administration of
intravenous contrast.
CONTRAST:  100mL OMNIPAQUE IOHEXOL 300 MG/ML  SOLN

[Series 2: abd/pelvis with · axial · 0.60mm/px · z∈[-320,+16]mm · 13 of 75 slices shown, 15 images]
[im 4/75  soft-tissue]
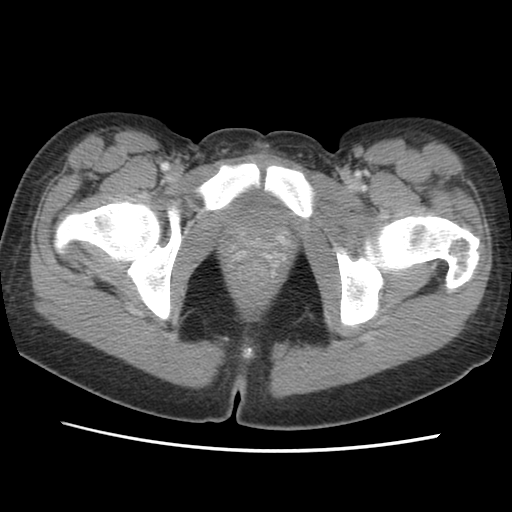
[im 4/75  bone]
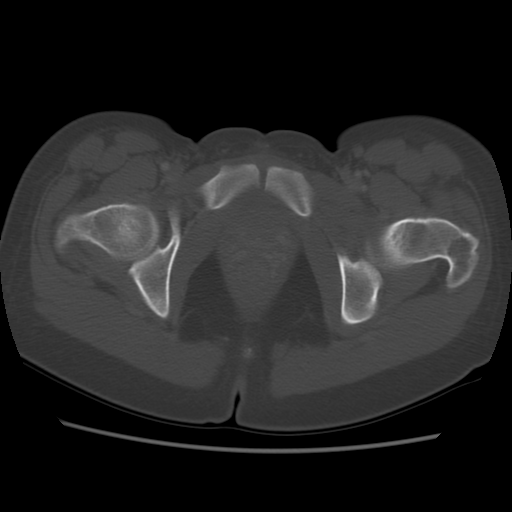
[im 10/75  soft-tissue]
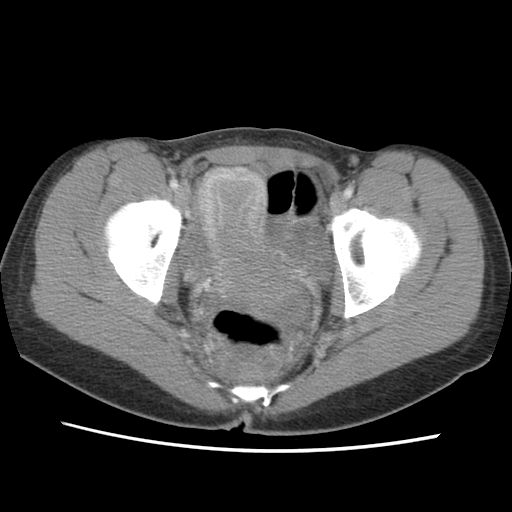
[im 16/75  soft-tissue]
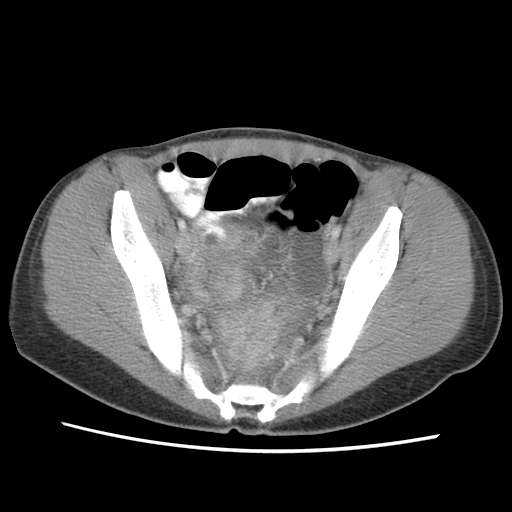
[im 22/75  soft-tissue]
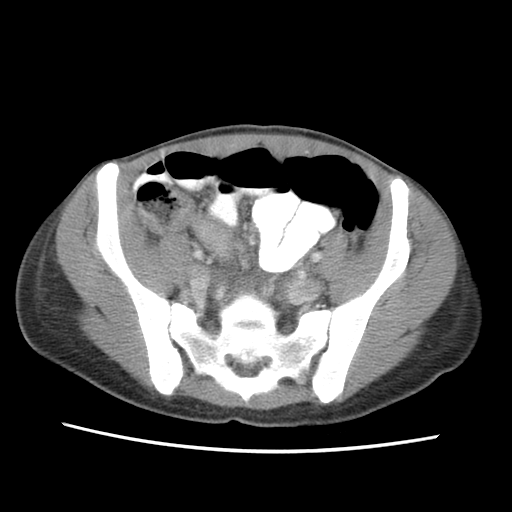
[im 25/75  soft-tissue]
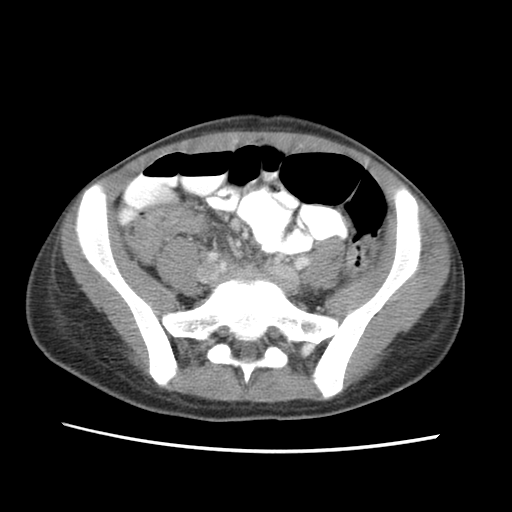
[im 31/75  soft-tissue]
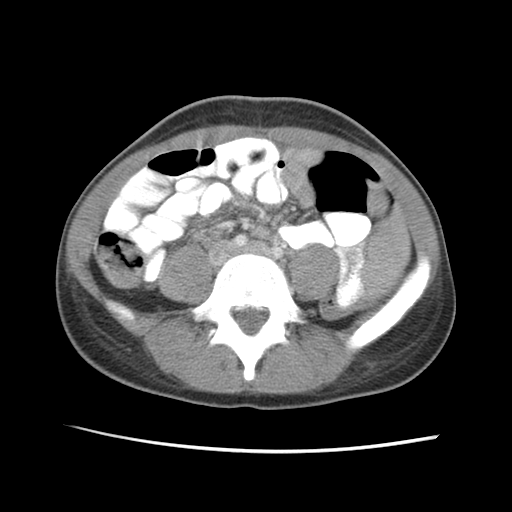
[im 38/75  soft-tissue]
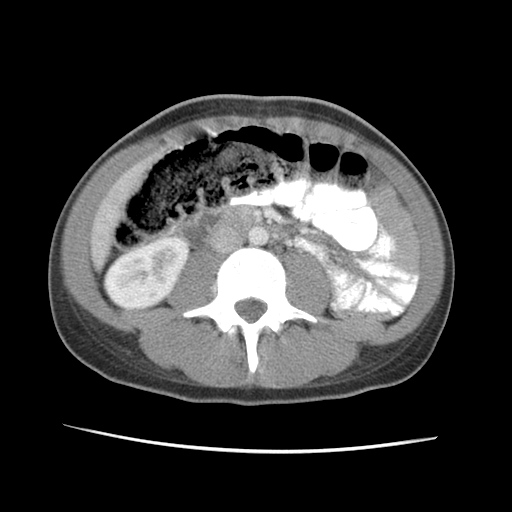
[im 44/75  soft-tissue]
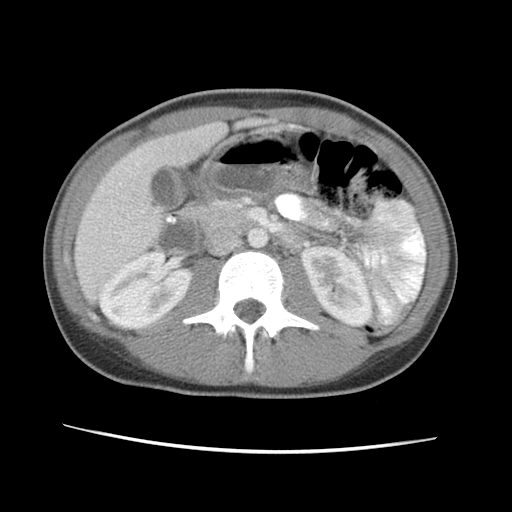
[im 50/75  soft-tissue]
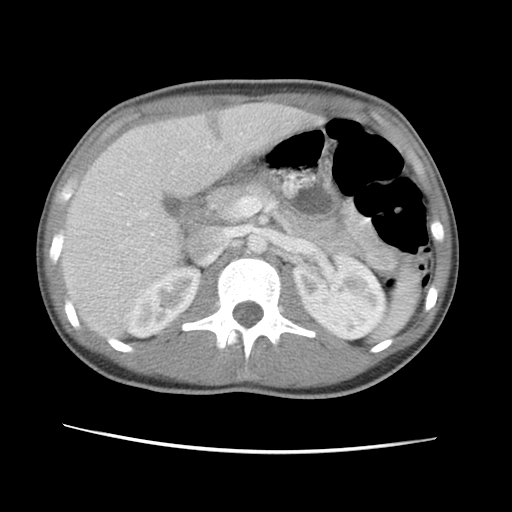
[im 50/75  bone]
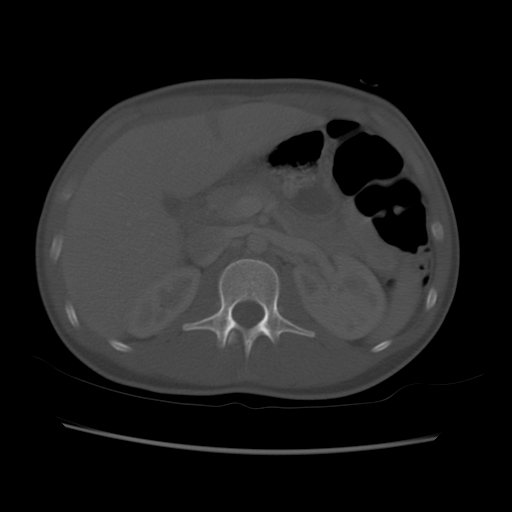
[im 53/75  soft-tissue]
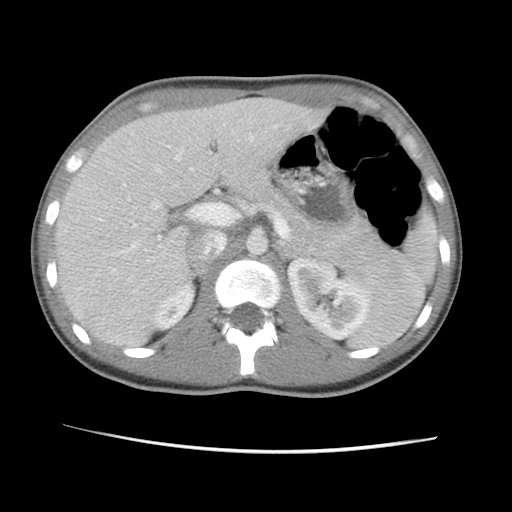
[im 59/75  soft-tissue]
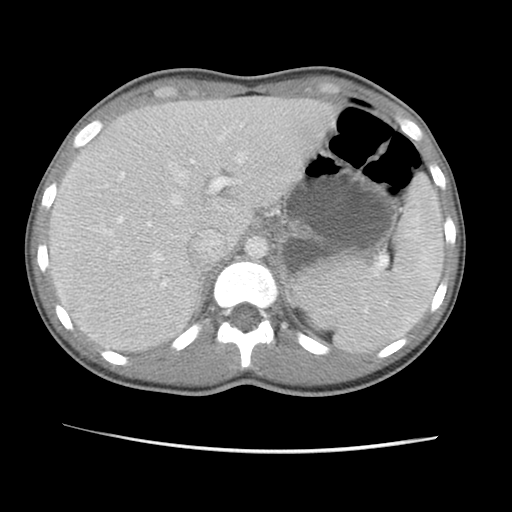
[im 65/75  soft-tissue]
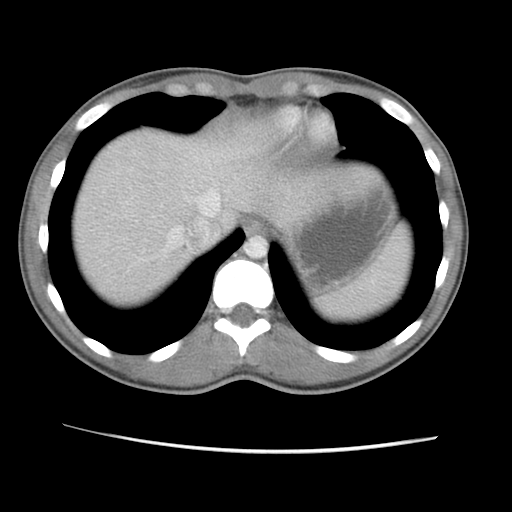
[im 71/75  soft-tissue]
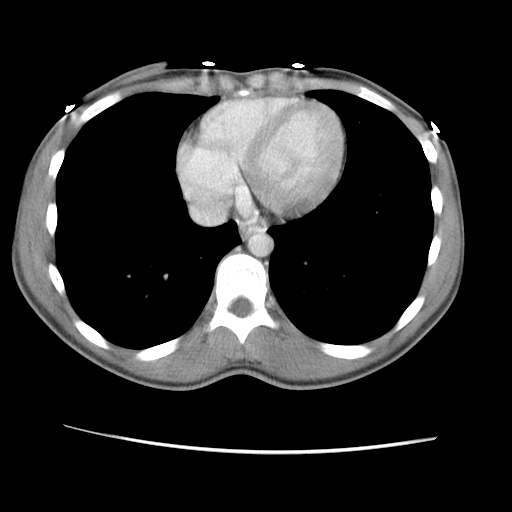

[Series 400: cor · coronal · 0.86mm/px · 3 of 112 slices shown]
[im 38/112  soft-tissue]
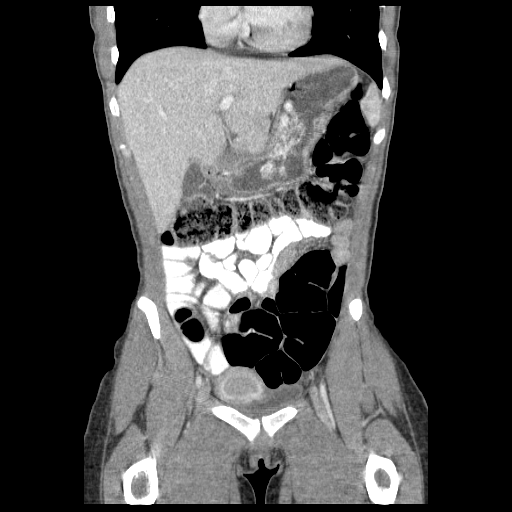
[im 50/112  soft-tissue]
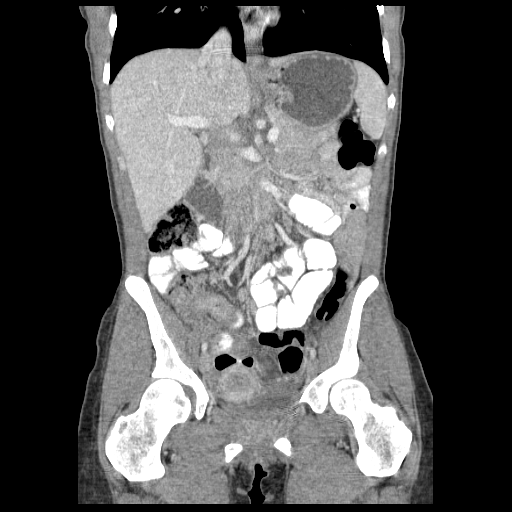
[im 62/112  soft-tissue]
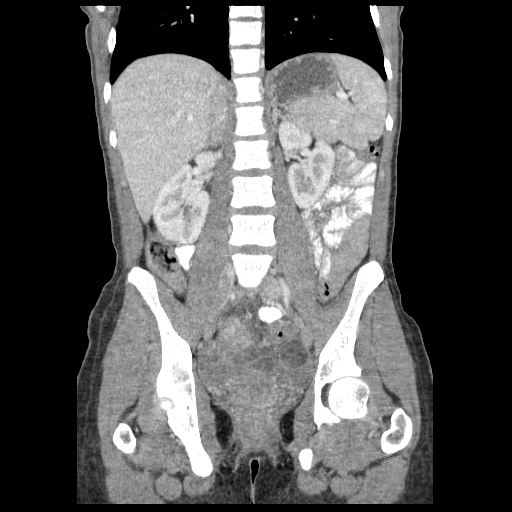

[16 of 46 positions shown; findings below may reference images not displayed]

FINDINGS: Lower chest:  The lung bases appear clear.  No pleural effusion.

Hepatobiliary: Normal appearance of the liver. The gallbladder
appears within normal limits. No biliary dilatation.

Pancreas: Normal appearance of the pancreas.

Spleen: The spleen appears normal.

Adrenals/Urinary Tract: The adrenal glands are both on unremarkable.
Normal appearance of the kidneys. The bladder appears normal for
degree of distention.

Stomach/Bowel: The stomach is normal. The proximal small bowel loops
have an unremarkable appearance. Within the left iliac fossa there
is a fluid filled and mildly dilated loop of small bowel which
measures up to 2.5 cm. This leads up to an abnormal appearing
terminal ileum. The single wall thickness of the terminal ileum
measures 8 mm. There is diffuse mucosal enhancement. Mild, hazy
infiltration of the adjacent fat is noted. The lumen of the terminal
ileum is significantly diminished compatible with the findings from
recent endoscopy. No evidence for bowel perforation. There is no
abscess identified. The colon stress set the proximal and mid colon
appear normal. Unopacified distal colon including the sigmoid colon
and rectum are difficult to assess. Within the limitations of
multiple unopacified pelvic bowel loops no evidence for pelvic
abscess identified. No definite evidence for perirectal abscess.

Vascular/Lymphatic: Normal caliber of the abdominal aorta. No
retroperitoneal or mesenteric adenopathy. There is no pelvic or
inguinal adenopathy.

Reproductive: The uterus and the adnexal structures are
unremarkable.

Other: None

Musculoskeletal: Unremarkable.
IMPRESSION: 1. The terminal ileum is diffusely abnormal. There is mucosal
enhancement, wall thickening and inflammation compatible with
enteritis. This may be inflammatory or infectious. Crohn's disease
is not excluded.

2. No definite evidence to suggest penetrating disease, bowel
perforation, or abscess formation. Assessment of the pelvis is
largely limited due to unopacified large and small bowel loops. If
there is a continued concern for penetrating disease then followup
imaging may be performed with both oral and rectal contrast or
possibly CT enterography.

## 2016-04-05 ENCOUNTER — Emergency Department (HOSPITAL_COMMUNITY)
Admission: EM | Admit: 2016-04-05 | Discharge: 2016-04-05 | Disposition: A | Discharge status: Home / Self Care | Payer: PRIVATE HEALTH INSURANCE | Attending: Emergency Medicine | Admitting: Emergency Medicine

## 2016-04-05 ENCOUNTER — Encounter (HOSPITAL_COMMUNITY): Payer: Self-pay | Admitting: Emergency Medicine

## 2016-04-05 DIAGNOSIS — R1032 Left lower quadrant pain: Secondary | ICD-10-CM | POA: Diagnosis present

## 2016-04-05 DIAGNOSIS — K5000 Crohn's disease of small intestine without complications: Secondary | ICD-10-CM | POA: Diagnosis not present

## 2016-04-05 DIAGNOSIS — K5090 Crohn's disease, unspecified, without complications: Secondary | ICD-10-CM

## 2016-04-05 HISTORY — DX: Crohn's disease, unspecified, without complications: K50.90

## 2016-04-05 LAB — LIPASE, BLOOD: LIPASE: 22 U/L (ref 11–51)

## 2016-04-05 LAB — URINALYSIS, ROUTINE W REFLEX MICROSCOPIC
Bilirubin Urine: NEGATIVE
GLUCOSE, UA: NEGATIVE mg/dL
Hgb urine dipstick: NEGATIVE
KETONES UR: NEGATIVE mg/dL
Nitrite: NEGATIVE
PH: 5 (ref 5.0–8.0)
Protein, ur: NEGATIVE mg/dL
SPECIFIC GRAVITY, URINE: 1.023 (ref 1.005–1.030)

## 2016-04-05 LAB — CBC
HCT: 40.2 % (ref 36.0–46.0)
Hemoglobin: 13.4 g/dL (ref 12.0–15.0)
MCH: 23.4 pg — ABNORMAL LOW (ref 26.0–34.0)
MCHC: 33.3 g/dL (ref 30.0–36.0)
MCV: 70.3 fL — ABNORMAL LOW (ref 78.0–100.0)
PLATELETS: 248 10*3/uL (ref 150–400)
RBC: 5.72 MIL/uL — AB (ref 3.87–5.11)
RDW: 14.2 % (ref 11.5–15.5)
WBC: 5.6 10*3/uL (ref 4.0–10.5)

## 2016-04-05 LAB — COMPREHENSIVE METABOLIC PANEL
ALK PHOS: 80 U/L (ref 38–126)
ALT: 12 U/L — ABNORMAL LOW (ref 14–54)
AST: 20 U/L (ref 15–41)
Albumin: 3.6 g/dL (ref 3.5–5.0)
Anion gap: 9 (ref 5–15)
BUN: 8 mg/dL (ref 6–20)
CO2: 26 mmol/L (ref 22–32)
CREATININE: 0.78 mg/dL (ref 0.44–1.00)
Calcium: 9.4 mg/dL (ref 8.9–10.3)
Chloride: 103 mmol/L (ref 101–111)
GFR calc Af Amer: >60 mL/min (ref >60)
Glucose, Bld: 84 mg/dL (ref 65–99)
Potassium: 4.1 mmol/L (ref 3.5–5.1)
Sodium: 138 mmol/L (ref 135–145)
TOTAL PROTEIN: 6.5 g/dL (ref 6.5–8.1)
Total Bilirubin: 0.3 mg/dL (ref 0.3–1.2)

## 2016-04-05 LAB — I-STAT BETA HCG BLOOD, ED (MC, WL, AP ONLY): I-stat hCG, quantitative: <5 m[IU]/mL (ref <5)

## 2016-04-05 MED ORDER — HYDROCODONE-ACETAMINOPHEN 5-325 MG PO TABS: 1.0000 | ORAL_TABLET | ORAL | 0 refills | Status: DC | PRN

## 2016-04-05 MED ORDER — CIPROFLOXACIN HCL 500 MG PO TABS
500.0000 mg | ORAL_TABLET | Freq: Two times a day (BID) | ORAL | Status: DC
Start: 2016-04-05 — End: 2016-04-05
  Administered 2016-04-05: 500 mg via ORAL
  Filled 2016-04-05: qty 1

## 2016-04-05 MED ORDER — PREDNISONE 20 MG PO TABS: ORAL_TABLET | ORAL | 0 refills | Status: AC

## 2016-04-05 MED ORDER — METRONIDAZOLE 500 MG PO TABS
500.0000 mg | ORAL_TABLET | Freq: Once | ORAL | Status: AC
  Administered 2016-04-05: 500 mg via ORAL
  Filled 2016-04-05: qty 1

## 2016-04-05 MED ORDER — CIPROFLOXACIN HCL 500 MG PO TABS: 500.0000 mg | ORAL_TABLET | Freq: Two times a day (BID) | ORAL | 0 refills | Status: AC

## 2016-04-05 MED ORDER — METRONIDAZOLE 500 MG PO TABS: 500.0000 mg | ORAL_TABLET | Freq: Two times a day (BID) | ORAL | 0 refills | Status: AC

## 2016-04-05 MED ORDER — PREDNISONE 20 MG PO TABS
60.0000 mg | ORAL_TABLET | Freq: Once | ORAL | Status: AC
  Administered 2016-04-05: 60 mg via ORAL
  Filled 2016-04-05: qty 3

## 2016-04-05 MED ORDER — HYDROCODONE-ACETAMINOPHEN 5-325 MG PO TABS
2.0000 | ORAL_TABLET | Freq: Once | ORAL | Status: AC
  Administered 2016-04-05: 2 via ORAL
  Filled 2016-04-05: qty 2

## 2016-04-05 NOTE — ED Provider Notes (Signed)
[facility]-EMERGENCY DEPT Provider Note   CSN: [id number] Arrival date & time: 04/05/16  1143     History   Chief Complaint Chief Complaint  Patient presents with  . Abdominal Pain    HPI Julia Mosley is a 21 y.o. female.  HPI Patient reports she has a history of Crohn's disease. She has had several intra-abdominal surgeries over the past 2 years. The patient goes to school here [facility] and she does not have a gastroenterologist in this area. Patient reports that over the past 4 days she has had increasing pain in her left lower abdomen. She reports the pain is severe. She has not had any vomiting or fever. She reports stool has remained formed and there is been no blood. She reports she had been on Humira in the past but off all of her Crohn's medications for several months. States this is because she was taken off them by her physician and have been tapered off prednisone as well. She reports she was doing pretty well up until just 3-4 days ago. She denies any pain or burning with urination. Past Medical History:  Diagnosis Date  . Chronic abdominal pain   . Crohn's disease (HCC)     There are no active problems to display for this patient.   History reviewed. No pertinent surgical history.  OB History    No data available       Home Medications    Prior to Admission medications   Medication Sig Start Date End Date Taking? Authorizing Provider  ciprofloxacin (CIPRO) 500 MG tablet Take 1 tablet (500 mg total) by mouth 2 (two) times daily. One po bid x 7 days 04/05/16   Arby Barrette, MD  dicyclomine (BENTYL) 20 MG tablet Take 1 tablet (20 mg total) by mouth 3 (three) times daily. 12/31/13   Pilar Jarvis, MD  diphenhydrAMINE (SOMINEX) 25 MG tablet Take 25 mg by mouth at bedtime as needed for allergies.    Historical Provider, MD  HYDROcodone-acetaminophen (NORCO/VICODIN) 5-325 MG tablet Take 1-2 tablets by mouth every 4 (four) hours as needed for moderate pain or severe pain.  04/05/16   Arby Barrette, MD  ibuprofen (ADVIL,MOTRIN) 200 MG tablet Take 400 mg by mouth every 6 (six) hours as needed (pain).    Historical Provider, MD  metroNIDAZOLE (FLAGYL) 500 MG tablet Take 1 tablet (500 mg total) by mouth 2 (two) times daily. One po bid x 7 days 04/05/16   Arby Barrette, MD  predniSONE (DELTASONE) 20 MG tablet 3 tabs po daily x 3 days, then 2 tabs x 3 days, then 1.5 tabs x 3 days, then 1 tab x 3 days, then 0.5 tabs x 3 days 04/05/16   Arby Barrette, MD    Family History History reviewed. No pertinent family history.  Social History Social History  Substance Use Topics  . Smoking status: Never Smoker  . Smokeless tobacco: Never Used  . Alcohol use Yes     Allergies   Patient has no known allergies.   Review of Systems Review of Systems 10 Systems reviewed and are negative for acute change except as noted in the HPI.   Physical Exam Updated Vital Signs BP 112/74   Pulse 82   Temp 98.3 F (36.8 C) (Oral)   Resp 16   SpO2 100%   Physical Exam  Constitutional: She appears well-developed and well-nourished. No distress.  HENT:  Head: Normocephalic and atraumatic.  Eyes: Conjunctivae and EOM are normal.  Neck: Neck supple.  Cardiovascular: Normal rate and regular rhythm.   No murmur heard. Pulmonary/Chest: Effort normal and breath sounds normal. No respiratory distress.  Abdominal: Soft. Bowel sounds are normal. She exhibits no distension. There is tenderness.  The patient has several very well healed scars on her abdominal wall. She has pain that localizes to the left mid lower quadrant. There is no guarding. Right lower quadrant is nontender. Upper quadrants nontender.  Musculoskeletal: She exhibits no edema.  Neurological: She is alert.  Skin: Skin is warm and dry.  Psychiatric: She has a normal mood and affect.  Nursing note and vitals reviewed.    ED Treatments / Results  Labs (all labs ordered are listed, but only abnormal results are  displayed) Labs Reviewed  COMPREHENSIVE METABOLIC PANEL - Abnormal; Notable for the following:       Result Value   ALT 12 (*)    All other components within normal limits  CBC - Abnormal; Notable for the following:    RBC 5.72 (*)    MCV 70.3 (*)    MCH 23.4 (*)    All other components within normal limits  URINALYSIS, ROUTINE W REFLEX MICROSCOPIC - Abnormal; Notable for the following:    APPearance HAZY (*)    Leukocytes, UA SMALL (*)    Bacteria, UA RARE (*)    Squamous Epithelial / LPF 0-5 (*)    All other components within normal limits  URINE CULTURE  LIPASE, BLOOD  I-STAT BETA HCG BLOOD, ED ([facility], [facility], [facility] ONLY)    EKG  EKG Interpretation None       Radiology No results found.  Procedures Procedures (including critical care time)  Medications Ordered in ED Medications  ciprofloxacin (CIPRO) tablet 500 mg (500 mg Oral Given 04/05/16 1929)  metroNIDAZOLE (FLAGYL) tablet 500 mg (500 mg Oral Given 04/05/16 1932)  HYDROcodone-acetaminophen (NORCO/VICODIN) 5-325 MG per tablet 2 tablet (2 tablets Oral Given 04/05/16 1928)  predniSONE (DELTASONE) tablet 60 mg (60 mg Oral Given 04/05/16 1928)     Initial Impression / Assessment and Plan / ED Course  I have reviewed the triage vital signs and the nursing notes.  Pertinent labs & imaging results that were available during my care of the patient were reviewed by me and considered in my medical decision making (see chart for details).  Clinical Course     Final Clinical Impressions(s) / ED Diagnoses   Final diagnoses:  Exacerbation of Crohn's disease without complication [facility])  Patient presents with history of Crohn's disease. She has been off any Crohn's maintenance medications for more than several months. Patient does reports she's had multiple CT scans done over the past 2 years. Last CT scan done at this facility was in 2015. At this time, the patient does not have fever or white count. She does not have guarding. We will  avoid repeat CT scan and initiate empiric treatment. Patient will be started on Cipro Flagyl and a course of prednisone. She is given Vicodin for pain as needed. Patient is counseled on the necessity of having a gastroenterologist in this area since she spends much of her time in [place]. Referral name provided. Patient is counseled on signs and symptoms for which return.  New Prescriptions New Prescriptions   CIPROFLOXACIN (CIPRO) 500 MG TABLET    Take 1 tablet (500 mg total) by mouth 2 (two) times daily. One po bid x 7 days   HYDROCODONE-ACETAMINOPHEN (NORCO/VICODIN) 5-325 MG TABLET    Take 1-2 tablets by mouth every 4 (  four) hours as needed for moderate pain or severe pain.   METRONIDAZOLE (FLAGYL) 500 MG TABLET    Take 1 tablet (500 mg total) by mouth 2 (two) times daily. One po bid x 7 days   PREDNISONE (DELTASONE) 20 MG TABLET    3 tabs po daily x 3 days, then 2 tabs x 3 days, then 1.5 tabs x 3 days, then 1 tab x 3 days, then 0.5 tabs x 3 days     Arby Barrette, MD 04/05/16 1952

## 2016-04-05 NOTE — ED Triage Notes (Signed)
Pt sts abd pain x 3 days that feels like a crohns flare

## 2016-04-05 NOTE — Discharge Instructions (Signed)
Call the gastroenterologist listed in your discharge instructions to set an appointment as soon as possible. Return to the emergency department if you develop fever, worsening pain, bloody stool or other concerning symptoms.

## 2016-04-07 LAB — URINE CULTURE

## 2016-09-12 ENCOUNTER — Emergency Department (HOSPITAL_COMMUNITY): Payer: Self-pay

## 2016-09-12 ENCOUNTER — Encounter (HOSPITAL_COMMUNITY): Payer: Self-pay | Admitting: Emergency Medicine

## 2016-09-12 ENCOUNTER — Emergency Department (HOSPITAL_COMMUNITY)
Admission: EM | Admit: 2016-09-12 | Discharge: 2016-09-13 | Disposition: A | Discharge status: Home / Self Care | Payer: Self-pay | Attending: Emergency Medicine | Admitting: Emergency Medicine

## 2016-09-12 DIAGNOSIS — Z79899 Other long term (current) drug therapy: Secondary | ICD-10-CM | POA: Insufficient documentation

## 2016-09-12 DIAGNOSIS — R1033 Periumbilical pain: Secondary | ICD-10-CM | POA: Insufficient documentation

## 2016-09-12 LAB — COMPREHENSIVE METABOLIC PANEL
ALK PHOS: 67 U/L (ref 38–126)
ALT: 14 U/L (ref 14–54)
ANION GAP: 5 (ref 5–15)
AST: 21 U/L (ref 15–41)
Albumin: 3.6 g/dL (ref 3.5–5.0)
BUN: 6 mg/dL (ref 6–20)
CALCIUM: 8.7 mg/dL — AB (ref 8.9–10.3)
CHLORIDE: 105 mmol/L (ref 101–111)
CO2: 24 mmol/L (ref 22–32)
Creatinine, Ser: 0.71 mg/dL (ref 0.44–1.00)
GFR calc Af Amer: >60 mL/min (ref >60)
Glucose, Bld: 91 mg/dL (ref 65–99)
Potassium: 3.2 mmol/L — ABNORMAL LOW (ref 3.5–5.1)
SODIUM: 134 mmol/L — AB (ref 135–145)
Total Bilirubin: 0.3 mg/dL (ref 0.3–1.2)
Total Protein: 6.3 g/dL — ABNORMAL LOW (ref 6.5–8.1)

## 2016-09-12 LAB — CBC
HCT: 36.7 % (ref 36.0–46.0)
HEMOGLOBIN: 11.9 g/dL — AB (ref 12.0–15.0)
MCH: 22.5 pg — AB (ref 26.0–34.0)
MCHC: 32.4 g/dL (ref 30.0–36.0)
MCV: 69.2 fL — ABNORMAL LOW (ref 78.0–100.0)
Platelets: 196 10*3/uL (ref 150–400)
RBC: 5.3 MIL/uL — AB (ref 3.87–5.11)
RDW: 15.5 % (ref 11.5–15.5)
WBC: 8.3 10*3/uL (ref 4.0–10.5)

## 2016-09-12 LAB — URINALYSIS, ROUTINE W REFLEX MICROSCOPIC
BILIRUBIN URINE: NEGATIVE
GLUCOSE, UA: NEGATIVE mg/dL
HGB URINE DIPSTICK: NEGATIVE
KETONES UR: 5 mg/dL — AB
Nitrite: NEGATIVE
PH: 5 (ref 5.0–8.0)
PROTEIN: 30 mg/dL — AB
Specific Gravity, Urine: 1.032 — ABNORMAL HIGH (ref 1.005–1.030)

## 2016-09-12 LAB — I-STAT CG4 LACTIC ACID, ED
Lactic Acid, Venous: 1.2 mmol/L (ref 0.5–1.9)
Lactic Acid, Venous: 1.27 mmol/L (ref 0.5–1.9)

## 2016-09-12 LAB — LIPASE, BLOOD: LIPASE: 31 U/L (ref 11–51)

## 2016-09-12 LAB — POC URINE PREG, ED: Preg Test, Ur: NEGATIVE

## 2016-09-12 MED ORDER — ONDANSETRON HCL 4 MG/2ML IJ SOLN
4.0000 mg | Freq: Once | INTRAMUSCULAR | Status: AC
  Administered 2016-09-12: 4 mg via INTRAVENOUS
  Filled 2016-09-12: qty 2

## 2016-09-12 MED ORDER — IOPAMIDOL (ISOVUE-300) INJECTION 61%
INTRAVENOUS | Status: AC
  Administered 2016-09-12: 100 mL
  Filled 2016-09-12: qty 100

## 2016-09-12 MED ORDER — MORPHINE SULFATE (PF) 4 MG/ML IV SOLN
4.0000 mg | Freq: Once | INTRAVENOUS | Status: AC
  Administered 2016-09-12: 4 mg via INTRAVENOUS
  Filled 2016-09-12: qty 1

## 2016-09-12 MED ORDER — KETOROLAC TROMETHAMINE 30 MG/ML IJ SOLN
30.0000 mg | Freq: Once | INTRAMUSCULAR | Status: AC
Start: 2016-09-13 — End: 2016-09-12
  Administered 2016-09-12: 30 mg via INTRAVENOUS
  Filled 2016-09-12: qty 1

## 2016-09-12 NOTE — ED Provider Notes (Signed)
[facility]Y DEPT Provider Note   CSN: [id number] Arrival date & time: 09/12/16  1812     History   Chief Complaint Chief Complaint  Patient presents with  . Abdominal Pain    HPI Julia Mosley is a 21 y.o. female.  21 year old female with a history of Crohn's colitis s/p abdominal surgery x 3 including ostomy creation and reversal presents to the ED for c/o abdominal pain. Patient reports noting pain at the site of her prior ostomy over the past 2 days. Pain is fairly constant, worsening, non-radiating. Symptoms associated with redness and subjective swelling to the area of her prior ostomy. No medications taken prior to arrival for symptoms. The patient has not had bowel movement today, but denies bowel changes. No associated fever, vomiting, nausea, diarrhea, dysuria, hematuria, or urinary frequency.  Patient has received care for her Crohn's in [place], [place]. Surgeons who performed her prior surgeries are also located in [place]. Surgeon who completed the patient's ostomy reversal is Dr. Florian Buff of [facility].      Past Medical History:  Diagnosis Date  . Chronic abdominal pain   . Crohn's disease (HCC)     There are no active problems to display for this patient.   History reviewed. No pertinent surgical history.  OB History    No data available       Home Medications    Prior to Admission medications   Medication Sig Start Date End Date Taking? Authorizing Provider  amoxicillin-clavulanate (AUGMENTIN) 875-125 MG tablet Take 1 tablet by mouth every 12 (twelve) hours. 09/13/16   Antony Madura, PA-C  ciprofloxacin (CIPRO) 500 MG tablet Take 1 tablet (500 mg total) by mouth 2 (two) times daily. One po bid x 7 days Patient not taking: Reported on 09/13/2016 04/05/16   Arby Barrette, MD  dicyclomine (BENTYL) 20 MG tablet Take 1 tablet (20 mg total) by mouth 3 (three) times daily. Patient not taking: Reported on 09/13/2016 12/31/13   Pilar Jarvis, MD  HYDROcodone-acetaminophen (NORCO/VICODIN) 5-325 MG tablet Take 1-2 tablets by mouth every 6 (six) hours as needed for moderate pain or severe pain. 09/13/16   Antony Madura, PA-C  metroNIDAZOLE (FLAGYL) 500 MG tablet Take 1 tablet (500 mg total) by mouth 2 (two) times daily. One po bid x 7 days Patient not taking: Reported on 09/13/2016 04/05/16   Arby Barrette, MD  predniSONE (DELTASONE) 20 MG tablet 3 tabs po daily x 3 days, then 2 tabs x 3 days, then 1.5 tabs x 3 days, then 1 tab x 3 days, then 0.5 tabs x 3 days Patient not taking: Reported on 09/13/2016 04/05/16   Arby Barrette, MD    Family History History reviewed. No pertinent family history.  Social History Social History  Substance Use Topics  . Smoking status: Never Smoker  . Smokeless tobacco: Never Used  . Alcohol use Yes     Allergies   Patient has no known allergies.   Review of Systems Review of Systems Ten systems reviewed and are negative for acute change, except as noted in the HPI.    Physical Exam Updated Vital Signs BP 108/72   Pulse (!) 104   Temp 99 F (37.2 C) (Oral)   Resp 18   Ht 5\' 2"  (1.575 m)   Wt 61.2 kg (135 lb)   LMP 08/09/2016   SpO2 97%   BMI 24.69 kg/m   Physical Exam  Constitutional: She is oriented to person, place, and time. She appears well-developed  and well-nourished. No distress.  Nontoxic and in NAD  HENT:  Head: Normocephalic and atraumatic.  Eyes: Conjunctivae and EOM are normal. No scleral icterus.  Neck: Normal range of motion.  Cardiovascular: Normal rate, regular rhythm and intact distal pulses.   Pulmonary/Chest: Effort normal. No respiratory distress. She has no wheezes.  Lungs CTAB  Abdominal: Soft. She exhibits no mass. There is tenderness. There is no guarding.  Soft, nondistended abdomen. Evidence of multiple prior surgeries. There is TTP at site of prior ostomy associated with mild erythema. No peritoneal signs.  Musculoskeletal: Normal range of  motion.  Neurological: She is alert and oriented to person, place, and time. She exhibits normal muscle tone. Coordination normal.  Skin: Skin is warm and dry. No rash noted. She is not diaphoretic. No erythema. No pallor.  Psychiatric: She has a normal mood and affect. Her behavior is normal.  Nursing note and vitals reviewed.       ED Treatments / Results  Labs (all labs ordered are listed, but only abnormal results are displayed) Labs Reviewed  COMPREHENSIVE METABOLIC PANEL - Abnormal; Notable for the following:       Result Value   Sodium 134 (*)    Potassium 3.2 (*)    Calcium 8.7 (*)    Total Protein 6.3 (*)    All other components within normal limits  CBC - Abnormal; Notable for the following:    RBC 5.30 (*)    Hemoglobin 11.9 (*)    MCV 69.2 (*)    MCH 22.5 (*)    All other components within normal limits  URINALYSIS, ROUTINE W REFLEX MICROSCOPIC - Abnormal; Notable for the following:    APPearance CLOUDY (*)    Specific Gravity, Urine 1.032 (*)    Ketones, ur 5 (*)    Protein, ur 30 (*)    Leukocytes, UA MODERATE (*)    Bacteria, UA RARE (*)    Squamous Epithelial / LPF TOO NUMEROUS TO COUNT (*)    All other components within normal limits  LIPASE, BLOOD  I-STAT CG4 LACTIC ACID, ED  POC URINE PREG, ED  I-STAT CG4 LACTIC ACID, ED    EKG  EKG Interpretation None       Radiology Ct Abdomen Pelvis W Contrast  Result Date: 09/12/2016 CLINICAL DATA:  21 year old female with history of ostomy presenting with abdominal wall redness. EXAM: CT ABDOMEN AND PELVIS WITH CONTRAST TECHNIQUE: Multidetector CT imaging of the abdomen and pelvis was performed using the standard protocol following bolus administration of intravenous contrast. CONTRAST:  ISOVUE-300 IOPAMIDOL (ISOVUE-300) INJECTION 61% COMPARISON:  Abdominal CT dated 01/16/2014 FINDINGS: Lower chest: The visualized lung bases are clear. No intra-abdominal free air.  Trace free fluid within the pelvis.  Hepatobiliary: No focal liver abnormality is seen. No gallstones, gallbladder wall thickening, or biliary dilatation. Pancreas: Unremarkable. No pancreatic ductal dilatation or surrounding inflammatory changes. Spleen: Normal in size without focal abnormality. Adrenals/Urinary Tract: Adrenal glands are unremarkable. Kidneys are normal, without renal calculi, focal lesion, or hydronephrosis. Bladder is unremarkable. Stomach/Bowel: There postsurgical changes of bowel surgery with resection of portion of the colon with anastomotic suture. There is no evidence of bowel obstruction or active inflammation. Evaluation of the bowel is however limited in the absence of oral contrast. There is abutment of loops of bowel to the anterior abdominal wall compatible with adhesions. The appendix is not visualized, likely surgically absent. Vascular/Lymphatic: No significant vascular findings are present. No enlarged abdominal or pelvic lymph nodes. Reproductive:  The uterus is anteverted and grossly unremarkable. The ovaries appear unremarkable as well. There is a 2 cm left ovarian corpus luteum. Other: There is a midline vertical anterior abdominal wall incisional scar. A 2.9 x 2.7 cm heterogeneous soft tissue protruding from the umbilicus may represent granulation tissue or inflamed fat containing umbilical hernia seen on the prior CT. There is diffuse thickening of the skin of the anterior abdominal wall at the level of the umbilicus. Top-normal bilateral inguinal lymph nodes noted, likely reactive. There is no fluid collection or hematoma. Musculoskeletal: No acute or significant osseous findings. IMPRESSION: 1. Postsurgical changes of bowel with partial colon resection. No evidence of bowel obstruction or active inflammation. 2. Thickening of the periumbilical skin, likely inflammatory in etiology. A lobulated stop soft tissue lesion protruding from the umbilicus may represent granulation tissue or inflamed fat containing  hernia. No drainable fluid collection or abscess. 3. A 2 cm left ovarian dominant follicle or corpus luteum. Electronically Signed   By: Elgie Collard M.D.   On: 09/12/2016 23:15    Procedures Procedures (including critical care time)  Medications Ordered in ED Medications  morphine 4 MG/ML injection 4 mg (4 mg Intravenous Given 09/12/16 2230)  ondansetron (ZOFRAN) injection 4 mg (4 mg Intravenous Given 09/12/16 2230)  iopamidol (ISOVUE-300) 61 % injection (100 mLs  Contrast Given 09/12/16 2244)  ketorolac (TORADOL) 30 MG/ML injection 30 mg (30 mg Intravenous Given 09/12/16 2355)  HYDROcodone-acetaminophen (NORCO/VICODIN) 5-325 MG per tablet 2 tablet (2 tablets Oral Given 09/13/16 0026)     Initial Impression / Assessment and Plan / ED Course  I have reviewed the triage vital signs and the nursing notes.  Pertinent labs & imaging results that were available during my care of the patient were reviewed by me and considered in my medical decision making (see chart for details).     10:15 PM Patient evaluated. Abdomen with changes consistent with multiple surgeries. No peritoneal signs. There is mild erythema around the umbilicus and associated tenderness. No leukocytosis. Normal lactate. Will obtain CT scan for further evaluation. Morphine ordered for pain.  11:30 PM CT findings reviewed which show post surgical changes of the bowel. No evidence of obstruction. There is thickening of the periumbilical skin suggestive of inflammatory etiology. Question granulation tissue formation versus inflamed fat containing hernia at the umbilicus.  12:15 AM Case discussed between my attending, Dr. Dalene Seltzer, and general surgery. Findings explained including granulation tissue vs incarcerated fat-containing hernia. Evidence of overlying erythema explained as well as hx of Crohn's. Dr. Andrey Campanile made aware of lack of fever, absence of leukocytosis, and normal lactate. Dr. Andrey Campanile of general surgery advises  oral abx and outpatient surgical follow up.  12:30 AM CT findings reviewed with the patient as well as plan of care. Patient expresses comfort with outpatient follow up. Return precautions given. Patient discharged in stable condition.   Final Clinical Impressions(s) / ED Diagnoses   Final diagnoses:  Periumbilical abdominal pain    New Prescriptions New Prescriptions   AMOXICILLIN-CLAVULANATE (AUGMENTIN) 875-125 MG TABLET    Take 1 tablet by mouth every 12 (twelve) hours.     Antony Madura, PA-C 09/13/16 0041    Alvira Monday, MD 09/14/16 1344

## 2016-09-12 NOTE — ED Notes (Signed)
On way to CT 

## 2016-09-12 NOTE — ED Notes (Signed)
ED Provider at bedside. 

## 2016-09-12 NOTE — ED Notes (Signed)
Pt denies N/V/D/Fever/Dysuria/frequent urination. Only complaining of pain around belly button where previous ostomy was located. Hx of Chron's Disease. States Small intestine removed two years ago.  Had an ostomy but was reversed.  States that drainage and pain around belly button began approximately one month ago.

## 2016-09-12 NOTE — ED Triage Notes (Signed)
Pt has ostomy reversal surgery 2 years ago. No problems afterwards. Pt states a month ago the site drained yellow/bloody fluid only lasting a few days.Two days ago, pt started having redness, swelling, abd pain at the site. No drainage at this time. Denies other symptoms. Normal bowel movements

## 2016-09-13 MED ORDER — AMOXICILLIN-POT CLAVULANATE 875-125 MG PO TABS: 1.0000 | ORAL_TABLET | Freq: Two times a day (BID) | ORAL | 0 refills | Status: AC

## 2016-09-13 MED ORDER — HYDROCODONE-ACETAMINOPHEN 5-325 MG PO TABS
2.0000 | ORAL_TABLET | Freq: Once | ORAL | Status: AC
  Administered 2016-09-13: 2 via ORAL
  Filled 2016-09-13: qty 2

## 2016-09-13 MED ORDER — HYDROCODONE-ACETAMINOPHEN 5-325 MG PO TABS: 1.0000 | ORAL_TABLET | Freq: Four times a day (QID) | ORAL | 0 refills | Status: AC | PRN

## 2016-09-13 NOTE — Discharge Instructions (Signed)
Your CT scan showed possible granulation tissue at the site of your old ostomy versus incarcerated hernia containing fat. Neither of these findings are concerning and there is no evidence of active infection at this time. We do advise the use of Augmentin to prevent development of infection. You may take Norco as needed for pain control. We recommend follow-up with your surgeon to ensure that symptoms resolved. You may follow-up with your GI doctor as needed.

## 2016-09-13 NOTE — ED Notes (Signed)
ED Provider at bedside. 

## 2017-06-19 ENCOUNTER — Other Ambulatory Visit: Payer: Self-pay

## 2017-06-19 ENCOUNTER — Emergency Department (HOSPITAL_COMMUNITY)
Admission: EM | Admit: 2017-06-19 | Discharge: 2017-06-19 | Discharge status: Against Medical Advice | Payer: PRIVATE HEALTH INSURANCE

## 2017-06-19 NOTE — ED Notes (Signed)
No  Answer x 3 

## 2017-06-20 ENCOUNTER — Emergency Department (HOSPITAL_COMMUNITY): Payer: BLUE CROSS/BLUE SHIELD

## 2017-06-20 ENCOUNTER — Other Ambulatory Visit: Payer: Self-pay

## 2017-06-20 ENCOUNTER — Encounter (HOSPITAL_BASED_OUTPATIENT_CLINIC_OR_DEPARTMENT_OTHER): Payer: Self-pay | Admitting: Emergency Medicine

## 2017-06-20 ENCOUNTER — Emergency Department (HOSPITAL_BASED_OUTPATIENT_CLINIC_OR_DEPARTMENT_OTHER)
Admission: EM | Admit: 2017-06-20 | Discharge: 2017-06-21 | Disposition: A | Discharge status: Home / Self Care | Payer: BLUE CROSS/BLUE SHIELD | Attending: Emergency Medicine | Admitting: Emergency Medicine

## 2017-06-20 DIAGNOSIS — Z79899 Other long term (current) drug therapy: Secondary | ICD-10-CM | POA: Insufficient documentation

## 2017-06-20 DIAGNOSIS — R1030 Lower abdominal pain, unspecified: Secondary | ICD-10-CM

## 2017-06-20 DIAGNOSIS — L03311 Cellulitis of abdominal wall: Secondary | ICD-10-CM | POA: Insufficient documentation

## 2017-06-20 LAB — CBC WITH DIFFERENTIAL/PLATELET
BASOS ABS: 0 10*3/uL (ref 0.0–0.1)
BASOS PCT: 0 %
EOS ABS: 0.2 10*3/uL (ref 0.0–0.7)
Eosinophils Relative: 2 %
HCT: 39.3 % (ref 36.0–46.0)
Hemoglobin: 13.1 g/dL (ref 12.0–15.0)
LYMPHS ABS: 2.3 10*3/uL (ref 0.7–4.0)
Lymphocytes Relative: 30 %
MCH: 22.8 pg — AB (ref 26.0–34.0)
MCHC: 33.3 g/dL (ref 30.0–36.0)
MCV: 68.5 fL — ABNORMAL LOW (ref 78.0–100.0)
MONO ABS: 0.4 10*3/uL (ref 0.1–1.0)
Monocytes Relative: 5 %
Neutro Abs: 4.7 10*3/uL (ref 1.7–7.7)
Neutrophils Relative %: 63 %
Platelets: 292 10*3/uL (ref 150–400)
RBC: 5.74 MIL/uL — ABNORMAL HIGH (ref 3.87–5.11)
RDW: 17 % — AB (ref 11.5–15.5)
WBC: 7.6 10*3/uL (ref 4.0–10.5)

## 2017-06-20 LAB — LIPASE, BLOOD: LIPASE: 34 U/L (ref 11–51)

## 2017-06-20 LAB — COMPREHENSIVE METABOLIC PANEL
ALBUMIN: 4 g/dL (ref 3.5–5.0)
ALK PHOS: 97 U/L (ref 38–126)
ALT: 15 U/L (ref 14–54)
ANION GAP: 8 (ref 5–15)
AST: 20 U/L (ref 15–41)
BILIRUBIN TOTAL: 0.5 mg/dL (ref 0.3–1.2)
BUN: 8 mg/dL (ref 6–20)
CALCIUM: 9 mg/dL (ref 8.9–10.3)
CO2: 26 mmol/L (ref 22–32)
CREATININE: 0.89 mg/dL (ref 0.44–1.00)
Chloride: 105 mmol/L (ref 101–111)
GFR calc Af Amer: >60 mL/min (ref >60)
GFR calc non Af Amer: >60 mL/min (ref >60)
Glucose, Bld: 95 mg/dL (ref 65–99)
Potassium: 3.4 mmol/L — ABNORMAL LOW (ref 3.5–5.1)
Sodium: 139 mmol/L (ref 135–145)
TOTAL PROTEIN: 7.3 g/dL (ref 6.5–8.1)

## 2017-06-20 LAB — URINALYSIS, ROUTINE W REFLEX MICROSCOPIC
Bilirubin Urine: NEGATIVE
Glucose, UA: NEGATIVE mg/dL
Hgb urine dipstick: NEGATIVE
Ketones, ur: NEGATIVE mg/dL
Leukocytes, UA: NEGATIVE
NITRITE: NEGATIVE
PH: 6 (ref 5.0–8.0)
Protein, ur: NEGATIVE mg/dL
SPECIFIC GRAVITY, URINE: 1.02 (ref 1.005–1.030)

## 2017-06-20 LAB — PREGNANCY, URINE: Preg Test, Ur: NEGATIVE

## 2017-06-20 MED ORDER — TRAMADOL HCL 50 MG PO TABS: 50.0000 mg | ORAL_TABLET | Freq: Four times a day (QID) | ORAL | 0 refills | Status: AC | PRN

## 2017-06-20 MED ORDER — IOPAMIDOL (ISOVUE-300) INJECTION 61%
INTRAVENOUS | Status: AC
  Administered 2017-06-20: 30 mL via ORAL
  Filled 2017-06-20: qty 30

## 2017-06-20 MED ORDER — FENTANYL CITRATE (PF) 100 MCG/2ML IJ SOLN
50.0000 ug | Freq: Once | INTRAMUSCULAR | Status: AC
  Administered 2017-06-20: 50 ug via INTRAVENOUS
  Filled 2017-06-20: qty 2

## 2017-06-20 MED ORDER — CLINDAMYCIN HCL 300 MG PO CAPS: 300.0000 mg | ORAL_CAPSULE | Freq: Four times a day (QID) | ORAL | 0 refills | Status: AC

## 2017-06-20 MED ORDER — CLINDAMYCIN PHOSPHATE 600 MG/50ML IV SOLN
600.0000 mg | Freq: Once | INTRAVENOUS | Status: AC
  Administered 2017-06-20: 600 mg via INTRAVENOUS
  Filled 2017-06-20: qty 50

## 2017-06-20 MED ORDER — IOPAMIDOL (ISOVUE-300) INJECTION 61%
INTRAVENOUS | Status: AC
  Administered 2017-06-20: 100 mL
  Filled 2017-06-20: qty 100

## 2017-06-20 MED ORDER — HYDROCORTISONE ACETATE 25 MG RE SUPP: 25.0000 mg | Freq: Two times a day (BID) | RECTAL | 0 refills | Status: AC

## 2017-06-20 NOTE — ED Provider Notes (Addendum)
Time seen 23:15 PM  Patient transferred to [facility] from [facility] med center ED because there is CT scanner is not working.  Patient reports she has had Crohn's disease for 3 years.  She is followed by gastroenterologist in [place], [place].  She was on Remicade and then Humira however she stopped those medications over a year ago because it was not helping.  She is currently in [place] attending college.  She states 4 days ago she started getting pain in her left abdomen just lower to her umbilicus.  She states this is different from her usual Crohn's pain.  She feels like the area is swollen and red.  She denies fever, nausea, vomiting, and has had intermittent diarrhea which is not unusual.  The past 2 days she has had 2 episodes per day.  She denies any rectal pain.  She states that the area is painful to touch.  She denies any prior history of cellulitis.  She states sometimes her Crohn's flareups are related to foods however she has not eaten anything recently that should cause a flareup.  She states normally she has a flareup she is placed on prednisone.  GI Jeanine Anghel in [place]  PE patient has a very flat affect.  She speaks slowly.  On exam of her abdomen she has multiple surgical scars keloid formation.  There is an area of redness that starts inferiorly and to the left of the umbilicus.  In the scar there is almost an area that looks like it is trying to form a pustule.  It is tender to palpation.       Bedside ultrasound had been done in the prior ED without localized fluid collection.  Medications  clindamycin (CLEOCIN) IVPB 600 mg (has no administration in time range)  fentaNYL (SUBLIMAZE) injection 50 mcg (has no administration in time range)  fentaNYL (SUBLIMAZE) injection 50 mcg (50 mcg Intravenous Given 06/20/17 1751)  iopamidol (ISOVUE-300) 61 % injection (100 mLs  Contrast Given 06/20/17 2133)  iopamidol (ISOVUE-300) 61 % injection (30 mLs  Oral Contrast Given 06/20/17 2003)     We discussed her laboratory and radiology test results.  I suspect she probably has a cellulitis.  The radiologist did not read the fluid collection as an abscess.  She is afebrile and her white blood cell count is normal.  She was given the option to be treated as an inpatient or outpatient and she has chosen to be treated as an outpatient.  She was given IV clindamycin in the ED and will be discharged home with oral clindamycin.  She is currently not having a rectal pain however CT is suggestive of proctitis, she was also discharged home with rectal suppositories of hydrocortisone.   Review of the [place] shows patient got 5 tramadol on February 27 from a dentist, 12 hydrocodone on January 10 from our ED, 60 tramadol in August 2017 and 12 hydrocodone 5/325 and August 2017.  Diagnoses that have been ruled out:  None  Diagnoses that are still under consideration:  None  Final diagnoses:  Lower abdominal pain  Cellulitis of abdominal wall  .  Discharge medications Tramadol Clindamycin HC rectal suppositories  Plan discharge  Devoria Albe, MD, Concha Pyo, MD 06/20/17 2352    Devoria Albe, MD 06/20/17 [phone]

## 2017-06-20 NOTE — ED Provider Notes (Signed)
22 year old female.  History of Crohn's disease.  Previous multiple surgeries and bowel resections.  Previous ostomy.  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  She describes 3-4 days of increasing abdominal pain adjacent to her umbilical region on the left.  No fever.  No chills.  No vomiting.  No other signs of Crohn's flaring.  Normal bowel habits.  No blood.  On exam has marked keloiding of her abdomen.  Firm indurated area.  Not reducible.  Not frankly fluctuant.  I am unable to determine exact location of any fluid or abscess cavity with certainty on bedside ultrasound.  Differential diagnosis is abdominal wall/ventral hernia, abdominal wall abscess.  Will require advanced imaging-CT for clarification.  CT scanner at med center [facility] currently down with expected greater than 12-hour time for recovery.  Will transfer to [facility].  I discussed with Dr. Effie Shy.  Patient will go POV with friend.  I have asked patient to be n.p.o.   Rolland Porter, MD 06/20/17 [phone]

## 2017-06-20 NOTE — ED Provider Notes (Signed)
[facility] EMERGENCY DEPARTMENT Provider Note   CSN: [id number] Arrival date & time: 06/20/17  1634     History   Chief Complaint Chief Complaint  Patient presents with  . Abdominal Pain    HPI Julia Mosley is a 22 y.o. female past medical history of Crohn's disease, ostomy who presents for evaluation of 4 days of lower abdominal pain.  Patient reports that she is also had 3 days and warmth, erythema, bulging of the left lower quadrant just adjacent to the umbilicus.  Patient states that she has not taken any medications for the pain.  She denies any alleviating or aggravating factors.  Patient states that she has had decreased appetite secondary to symptoms but denies any nausea/vomiting.  Patient states she has not had any fevers.  Patient reports that she has had multiple surgeries on her abdomen secondary to her Crohn's disease.  Patient had a recent reversal of ostomy in 2018 that was done by a Careers adviser in [facility].  Patient denies any fevers, chest pain, difficulty breathing, dysuria, hematuria, nausea/vomiting.  The history is provided by the patient.    Past Medical History:  Diagnosis Date  . Chronic abdominal pain   . Crohn's disease (HCC)     There are no active problems to display for this patient.   History reviewed. No pertinent surgical history.   OB History   None      Home Medications    Prior to Admission medications   Medication Sig Start Date End Date Taking? Authorizing Provider  amoxicillin-clavulanate (AUGMENTIN) 875-125 MG tablet Take 1 tablet by mouth every 12 (twelve) hours. 09/13/16   Antony Madura, PA-C  ciprofloxacin (CIPRO) 500 MG tablet Take 1 tablet (500 mg total) by mouth 2 (two) times daily. One po bid x 7 days Patient not taking: Reported on 09/13/2016 04/05/16   Arby Barrette, MD  dicyclomine (BENTYL) 20 MG tablet Take 1 tablet (20 mg total) by mouth 3 (three) times daily. Patient not taking: Reported on  09/13/2016 12/31/13   Pilar Jarvis, MD  HYDROcodone-acetaminophen (NORCO/VICODIN) 5-325 MG tablet Take 1-2 tablets by mouth every 6 (six) hours as needed for moderate pain or severe pain. 09/13/16   Antony Madura, PA-C  metroNIDAZOLE (FLAGYL) 500 MG tablet Take 1 tablet (500 mg total) by mouth 2 (two) times daily. One po bid x 7 days Patient not taking: Reported on 09/13/2016 04/05/16   Arby Barrette, MD  predniSONE (DELTASONE) 20 MG tablet 3 tabs po daily x 3 days, then 2 tabs x 3 days, then 1.5 tabs x 3 days, then 1 tab x 3 days, then 0.5 tabs x 3 days Patient not taking: Reported on 09/13/2016 04/05/16   Arby Barrette, MD    Family History History reviewed. No pertinent family history.  Social History Social History   Tobacco Use  . Smoking status: Never Smoker  . Smokeless tobacco: Never Used  Substance Use Topics  . Alcohol use: Yes  . Drug use: No     Allergies   Patient has no known allergies.   Review of Systems Review of Systems  Constitutional: Positive for appetite change. Negative for fever.  Respiratory: Negative for cough and shortness of breath.   Cardiovascular: Negative for chest pain.  Gastrointestinal: Positive for abdominal pain. Negative for nausea and vomiting.  Genitourinary: Negative for dysuria and hematuria.  Neurological: Negative for headaches.  All other systems reviewed and are negative.    Physical Exam Updated Vital Signs BP  111/78 (BP Location: Right Arm)   Pulse 86   Temp 98.1 F (36.7 C) (Oral)   Resp 18   Ht 5\' 1"  (1.549 m)   Wt 61.2 kg (135 lb)   SpO2 100%   BMI 25.51 kg/m   Physical Exam  Constitutional: She is oriented to person, place, and time. She appears well-developed and well-nourished.  Appears uncomfortable but no acute distress   HENT:  Head: Normocephalic and atraumatic.  Mouth/Throat: Oropharynx is clear and moist and mucous membranes are normal.  Eyes: Pupils are equal, round, and reactive to light. Conjunctivae,  EOM and lids are normal.  Neck: Full passive range of motion without pain.  Cardiovascular: Normal rate, regular rhythm, normal heart sounds and normal pulses. Exam reveals no gallop and no friction rub.  No murmur heard. Pulmonary/Chest: Effort normal and breath sounds normal.  Abdominal: Soft. Normal appearance. There is tenderness in the periumbilical area and left lower quadrant. There is no rigidity, no guarding and no CVA tenderness.  Abdomen with multiple well-healed surgical incision scars with overlying keloids present.  There is a small umbilicus hernia that appears to be reducible.  Just adjacent to the left of the umbilicus, is an area of diffuse warmth, erythema with tenderness to palpation.  Abdomen is otherwise soft, no rigidity, guarding, peritoneal signs.  No CVA tenderness bilaterally.  Musculoskeletal: Normal range of motion.  Neurological: She is alert and oriented to person, place, and time.  Skin: Skin is warm and dry. Capillary refill takes less than 2 seconds.  Psychiatric: She has a normal mood and affect. Her speech is normal.  Nursing note and vitals reviewed.    ED Treatments / Results  Labs (all labs ordered are listed, but only abnormal results are displayed) Labs Reviewed  COMPREHENSIVE METABOLIC PANEL - Abnormal; Notable for the following components:      Result Value   Potassium 3.4 (*)    All other components within normal limits  CBC WITH DIFFERENTIAL/PLATELET - Abnormal; Notable for the following components:   RBC 5.74 (*)    MCV 68.5 (*)    MCH 22.8 (*)    RDW 17.0 (*)    All other components within normal limits  PREGNANCY, URINE  URINALYSIS, ROUTINE W REFLEX MICROSCOPIC  LIPASE, BLOOD    EKG None  Radiology No results found.  Procedures Procedures (including critical care time)  Medications Ordered in ED Medications  fentaNYL (SUBLIMAZE) injection 50 mcg (50 mcg Intravenous Given 06/20/17 1751)     Initial Impression / Assessment  and Plan / ED Course  I have reviewed the triage vital signs and the nursing notes.  Pertinent labs & imaging results that were available during my care of the patient were reviewed by me and considered in my medical decision making (see chart for details).     22 year old female who presents for evaluation of lower abdominal pain that is been ongoing for the last 4 days.  Reports that 3 days ago, she started having some redness and warmth to the left lower quadrant.  Patient with history of multiple abdominal surgeries, recent reversal of her ostomy in 2018.  Surgery done in [place].  No fevers, nausea/vomiting, urinary complaints.  Patient does report decreased p.o. Patient is afebrile, non-toxic appearing, sitting comfortably on examination table. Vital signs reviewed and stable.  On exam, abdomen has multiple well-healed surgical incision scars with keloid overlying the areas.  The umbilicus has a small reducible hernia.  Just adjacent to the umbilicus  on the left lower quadrant, there is an area of tenderness, warmth, erythema.  Concern for incarcerated hernia versus abscess.  Plan to check basic labs.   Bedside ultrasound applied.  Unclear if etiology is from hernia or abscess.  Patient will need further CT abdomen pelvis imaging for full evaluation of her symptoms today.  At this facility at this time, there is no CT scanner and patient will need further imaging at [facility] long ED.  Dr. Fayrene Fearing discussed with Dr. Effie Shy who is accepting patient to [facility] long ED.  CBC without any significant leukocytosis.  The MP unremarkable.  UA is negative for any acute infectious etiology.  BMP shows slight hypokalemia but otherwise unremarkable.  Discussed results with patient.  Updated patient on plan to transfer to [facility] long ED.  Patient given option of [facility] or by ambulance and patient does not want to go by ambulance.  Patient has had a friend come to the ED who will transfer her to [facility] POV.  I  instructed patient to go directly to the [facility] long emergency department for further imaging evaluation.  Patient instructed not to go home or make any stops. Patient had ample opportunity for questions and discussion. All patient's questions were answered with full understanding.  Final Clinical Impressions(s) / ED Diagnoses   Final diagnoses:  Lower abdominal pain    ED Discharge Orders    None       Rosana Hoes 06/20/17 1827    Rolland Porter, MD 06/26/17 2350

## 2017-06-20 NOTE — Discharge Instructions (Addendum)
Use heat on the painful area. Take the antibiotics until gone. Take the tramadol with acetaminophen 650 mg 4 times a day for pain. Return to the ED for fever, worsening swelling, redness, pain, vomiting or if you just feel worse. I gave you the name of a surgeon in town who can recheck the area in 2 days to make sure it doesn't develop into an abscess (collection of pus).

## 2017-06-20 NOTE — ED Triage Notes (Signed)
Patient states that she has had pain to her abdominal area x 4 days - Denies any N/V

## 2017-06-20 NOTE — ED Notes (Signed)
Pt stated that her IV was bothering her. This RN offered to remove it for her, but warned her that she may require access later on. She verbalized understanding and stated that she would keep it for now.
